# Patient Record
Sex: Female | Born: 1980 | Hispanic: Yes | Marital: Married | Smoking: Never smoker
Health system: Southern US, Community
[De-identification: ages and names within clinical notes are randomized; demographics above are authoritative.]

## PROBLEM LIST (undated history)

## (undated) DIAGNOSIS — F329 Major depressive disorder, single episode, unspecified: Secondary | ICD-10-CM

## (undated) DIAGNOSIS — A159 Respiratory tuberculosis unspecified: Secondary | ICD-10-CM

## (undated) DIAGNOSIS — F32A Depression, unspecified: Secondary | ICD-10-CM

## (undated) DIAGNOSIS — F419 Anxiety disorder, unspecified: Secondary | ICD-10-CM

## (undated) HISTORY — PX: NO PAST SURGERIES: SHX2092

## (undated) HISTORY — DX: Respiratory tuberculosis unspecified: A15.9

---

## 2004-08-10 ENCOUNTER — Observation Stay: Payer: Self-pay | Admitting: Obstetrics and Gynecology

## 2004-08-14 ENCOUNTER — Observation Stay: Payer: Self-pay | Admitting: Certified Nurse Midwife

## 2004-08-15 ENCOUNTER — Inpatient Hospital Stay: Payer: Self-pay | Admitting: Obstetrics and Gynecology

## 2005-08-07 ENCOUNTER — Emergency Department: Payer: Self-pay | Admitting: Emergency Medicine

## 2006-01-05 ENCOUNTER — Inpatient Hospital Stay: Payer: Self-pay

## 2008-02-15 ENCOUNTER — Inpatient Hospital Stay: Payer: Self-pay | Admitting: Obstetrics and Gynecology

## 2009-11-10 ENCOUNTER — Ambulatory Visit: Payer: Self-pay | Admitting: Family Medicine

## 2010-03-28 ENCOUNTER — Inpatient Hospital Stay: Payer: Self-pay | Admitting: Obstetrics and Gynecology

## 2014-01-29 ENCOUNTER — Encounter: Payer: Self-pay | Admitting: Obstetrics & Gynecology

## 2014-03-24 ENCOUNTER — Ambulatory Visit: Payer: Self-pay | Admitting: Family Medicine

## 2014-04-20 ENCOUNTER — Observation Stay: Payer: Self-pay | Admitting: Obstetrics and Gynecology

## 2014-04-24 ENCOUNTER — Inpatient Hospital Stay: Payer: Self-pay

## 2014-04-24 LAB — CBC WITH DIFFERENTIAL/PLATELET
BASOS ABS: 0 10*3/uL (ref 0.0–0.1)
BASOS PCT: 0.5 %
EOS ABS: 0 10*3/uL (ref 0.0–0.7)
Eosinophil %: 0.4 %
HCT: 39.9 % (ref 35.0–47.0)
HGB: 13.2 g/dL (ref 12.0–16.0)
LYMPHS PCT: 17.1 %
Lymphocyte #: 1.7 10*3/uL (ref 1.0–3.6)
MCH: 30.1 pg (ref 26.0–34.0)
MCHC: 33.2 g/dL (ref 32.0–36.0)
MCV: 91 fL (ref 80–100)
MONO ABS: 0.5 x10 3/mm (ref 0.2–0.9)
MONOS PCT: 4.7 %
NEUTROS ABS: 7.9 10*3/uL — AB (ref 1.4–6.5)
Neutrophil %: 77.3 %
PLATELETS: 230 10*3/uL (ref 150–440)
RBC: 4.4 10*6/uL (ref 3.80–5.20)
RDW: 14.8 % — AB (ref 11.5–14.5)
WBC: 10.2 10*3/uL (ref 3.6–11.0)

## 2014-04-25 LAB — HEMATOCRIT: HCT: 36.2 % (ref 35.0–47.0)

## 2015-02-02 NOTE — H&P (Signed)
L&D Evaluation:  History:  HPI 34 y/[facility]4004 [facility]. Is in active labor, uc's beginning this am, small bloody show, denies leaking fluid, baby is actve. care @ [facility] late entry to care, HX PPH, ABN pap, GBS negative.   Presents with contractions   Patient's Medical History No Chronic Illness   Patient's Surgical History none   Medications Pre Natal Vitamins   Allergies NKDA   Social History none   Family History Non-Contributory   ROS:  ROS All systems were reviewed.  HEENT, CNS, GI, GU, Respiratory, CV, Renal and Musculoskeletal systems were found to be normal.   Exam:  Vital Signs stable   Urine Protein not completed   General no apparent distress   Mental Status clear   Chest clear   Heart normal sinus rhythm   Abdomen gravid, non-tender   Estimated Fetal Weight Average for gestational age   Fetal Position vtx   Fundal Height term   Back no CVAT   Edema 1+   Reflexes 2+   Clonus negative   Pelvic no external lesions, 5cm upon admission recent exam 7-8cm BBOW vtx @ -1 small show.   Mebranes Intact   FHT normal rate with no decels, baseline 130's 140's avg variability withaccels   Fetal Heart Rate 144   Ucx regular, Q 2/3 mins 60 sec strong   Skin dry   Impression:  Impression active labor   Plan:  Plan monitor contractions and for cervical change   Comments Admitted, knows what to expect, husband at bedside supportive. Interpreter notified. Requests pain meds, stadol given, decelines epidural.   Electronic Signatures: Albertina ParrLugiano, Halimah Bewick B (CNM)  (Signed 31-Jul-15 09:40)  Authored: L&D Evaluation   Last Updated: 31-Jul-15 09:40 by Albertina ParrLugiano, Kasmira Cacioppo B (CNM)

## 2016-07-06 LAB — HM PAP SMEAR: HM PAP: NEGATIVE

## 2017-09-25 NOTE — L&D Delivery Note (Signed)
Delivery Note At 9:56 AM a viable and female child was delivered via Vaginal, Spontaneous (Presentation: VTX ;  ).  APGAR: 8, 9; weight 6 lb 9.5 oz (2990 g).   Placenta status: intact , .  Cord: delayed cord clamping with the following complications: none.   Head delivered within 5 minutes of pushing . Student Bevelyn NgoMaggie Cheek assisted me with the delivery . Shoulder and body delivered with difficulty  0.2 mg Methergine IM administered prophylactically given her prior h/o of PPH  Anesthesia:  CLE Episiotomy:none   Lacerations: second degree  Suture Repair: 2.0 3.0 vicryl Est. Blood Loss (mL):  200cc  Mom to postpartum.  Baby to Couplet care / Skin to Skin.  Amber Stark 04/24/2018, 10:17 AM

## 2017-12-28 LAB — OB RESULTS CONSOLE HGB/HCT, BLOOD
HCT: 34
Hemoglobin: 11.1

## 2017-12-28 LAB — OB RESULTS CONSOLE PLATELET COUNT: Platelets: 298

## 2017-12-28 LAB — OB RESULTS CONSOLE HIV ANTIBODY (ROUTINE TESTING): HIV: NONREACTIVE

## 2017-12-29 LAB — OB RESULTS CONSOLE VARICELLA ZOSTER ANTIBODY, IGG: Varicella: IMMUNE

## 2017-12-29 LAB — OB RESULTS CONSOLE ABO/RH: RH TYPE: POSITIVE

## 2017-12-29 LAB — OB RESULTS CONSOLE HEPATITIS B SURFACE ANTIGEN: HEP B S AG: NEGATIVE

## 2017-12-29 LAB — OB RESULTS CONSOLE RUBELLA ANTIBODY, IGM: Rubella: IMMUNE

## 2017-12-31 ENCOUNTER — Other Ambulatory Visit: Payer: Self-pay | Admitting: Advanced Practice Midwife

## 2017-12-31 DIAGNOSIS — Z3689 Encounter for other specified antenatal screening: Secondary | ICD-10-CM

## 2017-12-31 LAB — OB RESULTS CONSOLE GC/CHLAMYDIA
CHLAMYDIA, DNA PROBE: NEGATIVE
GC PROBE AMP, GENITAL: NEGATIVE

## 2018-01-10 ENCOUNTER — Ambulatory Visit: Payer: Self-pay

## 2018-01-10 ENCOUNTER — Ambulatory Visit
Admission: RE | Admit: 2018-01-10 | Discharge: 2018-01-10 | Disposition: A | Discharge status: Home / Self Care | Payer: Self-pay | Source: Ambulatory Visit | Attending: Maternal & Fetal Medicine | Admitting: Maternal & Fetal Medicine

## 2018-01-10 VITALS — BP 139/82 | HR 85 | Temp 98.2°F | Resp 18 | Wt 140.2 lb

## 2018-01-10 DIAGNOSIS — Z3689 Encounter for other specified antenatal screening: Secondary | ICD-10-CM

## 2018-01-10 DIAGNOSIS — O0932 Supervision of pregnancy with insufficient antenatal care, second trimester: Secondary | ICD-10-CM | POA: Insufficient documentation

## 2018-01-10 DIAGNOSIS — O09522 Supervision of elderly multigravida, second trimester: Secondary | ICD-10-CM | POA: Insufficient documentation

## 2018-01-10 DIAGNOSIS — Z3A24 24 weeks gestation of pregnancy: Secondary | ICD-10-CM | POA: Insufficient documentation

## 2018-01-10 NOTE — Progress Notes (Addendum)
Referring Provider:   [facility] Department Length of Consultation: 45 minutes  [name] was referred to [facility] for genetic counseling because of advanced maternal age.  The patient will be 37 years old at the time of delivery.  This note summarizes the information we discussed with the aid of a Spanish interpreter.    We explained that the chance of a chromosome abnormality increases with maternal age.  Chromosomes and examples of chromosome problems were reviewed.  Humans typically have 46 chromosomes in each cell, with half passed through each sperm and egg.  Any change in the number or structure of chromosomes can increase the risk of problems in the physical and mental development of a pregnancy.   Based upon age of the patient, the chance of any chromosome abnormality was 1 in 89. The chance of Down syndrome, the most common chromosome problem associated with maternal age, was 1 in 83.  The risk of chromosome problems is in addition to the 3% general population risk for birth defects and mental retardation.  The greatest chance, of course, is that the baby would be born in good health.  We discussed the following prenatal screening and testing options for this pregnancy given the current gestational age:  Targeted ultrasound uses high frequency sound waves to create an image of the developing fetus.  An ultrasound is often recommended as a routine means of evaluating the pregnancy.  It is also used to screen for fetal anatomy problems (for example, a heart defect) that might be suggestive of a chromosomal or other abnormality.   Amniocentesis involves the removal of a small amount of amniotic fluid from the sac surrounding the fetus with the use of a thin needle inserted through the maternal abdomen and uterus.  Ultrasound guidance is used throughout the procedure.  Fetal cells from amniotic fluid are directly evaluated and > 99.5% of  chromosome problems and > 98% of open neural tube defects can be detected. This procedure is generally performed after the 15th week of pregnancy.  The main risks to this procedure include complications leading to miscarriage in less than 1 in 200 cases (0.5%).  We also reviewed the availability of cell free fetal DNA testing from maternal blood to determine whether or not the baby may have either Down syndrome, trisomy 40, or trisomy 92.  This test utilizes a maternal blood sample and DNA sequencing technology to isolate circulating cell free fetal DNA from maternal plasma.  The fetal DNA can then be analyzed for DNA sequences that are derived from the three most common chromosomes involved in aneuploidy, chromosomes 13, 18, and 21.  If the overall amount of DNA is greater than the expected level for any of these chromosomes, aneuploidy is suspected.  While we do not consider it a replacement for invasive testing and karyotype analysis, a negative result from this testing would be reassuring, though not a guarantee of a normal chromosome complement for the baby.  An abnormal result is certainly suggestive of an abnormal chromosome complement, though we would still recommend amniocentesis to confirm any findings from this testing.  Cystic Fibrosis and Spinal Muscular Atrophy (SMA) screening were also discussed with the patient. Both conditions are recessive, which means that both parents must be carriers in order to have a child with the disease.  Cystic fibrosis (CF) is one of the most common genetic conditions in persons of Caucasian ancestry.  This condition occurs in approximately 1 in 2,500  Caucasian persons and results in thickened secretions in the lungs, digestive, and reproductive systems.  For a baby to be at risk for having CF, both of the parents must be carriers for this condition.  Approximately 1 in 3625 Caucasian persons is a carrier for CF.  Current carrier testing looks for the most common  mutations in the gene for CF and can detect approximately 90% of carriers in the Caucasian population.  This means that the carrier screening can greatly reduce, but cannot eliminate, the chance for an individual to have a child with CF.  If an individual is found to be a carrier for CF, then carrier testing would be available for the partner. As part of [place] newborn screening profile, all babies born in the state of [place] will have a two-tier screening process.  Specimens are first tested to determine the concentration of immunoreactive trypsinogen (IRT).  The top 5% of specimens with the highest IRT values then undergo DNA testing using a panel of over 40 common CF mutations. SMA is a neurodegenerative disorder that leads to atrophy of skeletal muscle and overall weakness.  This condition is also more prevalent in the Caucasian population, with 1 in 40-1 in 60 persons being a carrier and 1 in 6,000-1 in 10,000 children being affected.  There are multiple forms of the disease, with some causing death in infancy to other forms with survival into adulthood.  The genetics of SMA is complex, but carrier screening can detect up to 95% of carriers in the Caucasian population.  Similar to CF, a negative result can greatly reduce, but cannot eliminate, the chance to have a child with SMA.  We obtained a detailed family history and pregnancy history.  The remainder of the family history is unremarkable for birth defects, developmental delays, recurrent pregnancy loss or known chromosome abnormalities.  [name] stated that this is the sixth pregnancy for she and her husband, Amber Stark.  She reported no complications or exposures that would be expected to increase the risk for birth defects.  After consideration of the options, [name] elected to proceed with MaterniT21 PLUS with SCA.  She declined CF and SMA carrier screening.  Prior hemoglobinopathy screening was normal at  [facility].  An ultrasound was performed at the time of the visit.  The gestational age was consistent with  24 weeks, 1 day.   No markers of aneuploidy were noted, but it is important to remember that a normal ultrasound does not exclude the possibility of birth defect or chromosome condition.  Please refer to the ultrasound report for details of that study.  [name] was encouraged to call with questions or concerns.  We can be contacted at [phone].   Tests Ordered: MaterniT21 PLUS with SCA   Cherly Andersoneborah F. Wells, MS, CGC  I was immediately available and supervising. Argentina PonderAndra H. Isaiah Torok, MD [facility]

## 2018-01-16 LAB — MATERNIT21 PLUS CORE+SCA
CHROMOSOME 13: NEGATIVE
Chromosome 18: NEGATIVE
Chromosome 21: NEGATIVE
Y Chromosome: NOT DETECTED

## 2018-01-17 ENCOUNTER — Telehealth: Payer: Self-pay | Admitting: Obstetrics and Gynecology

## 2018-01-17 NOTE — Telephone Encounter (Signed)
The patient was informed of the results of her recent MaterniT21 testing which yielded NEGATIVE results.  The patient's specimen showed DNA consistent with two copies of chromosomes 21, 18 and 13.  The sensitivity for trisomy 3221, trisomy 9218 and trisomy 2813 using this testing are reported as 99.1%, 99.9% and 91.7% respectively.  Thus, while the results of this testing are highly accurate, they are not considered diagnostic at this time.  Should more definitive information be desired, the patient may still consider amniocentesis.   As requested to know by the patient, sex chromosome analysis was included for this sample.  Results was consistent with a female fetus. This is predicted with >99% accuracy.    Cherly Andersoneborah F. Bettyann Birchler, MS, CGC

## 2018-01-24 ENCOUNTER — Other Ambulatory Visit: Payer: Self-pay

## 2018-01-24 DIAGNOSIS — Z62810 Personal history of physical and sexual abuse in childhood: Secondary | ICD-10-CM | POA: Insufficient documentation

## 2018-02-07 NOTE — Addendum Note (Signed)
Encounter addended by: Lady Deutscher, MD on: 02/07/2018 8:42 AM  Actions taken: Sign clinical note

## 2018-02-15 LAB — OB RESULTS CONSOLE HIV ANTIBODY (ROUTINE TESTING): HIV: NONREACTIVE

## 2018-02-16 LAB — OB RESULTS CONSOLE RPR: RPR: NONREACTIVE

## 2018-04-07 LAB — OB RESULTS CONSOLE GBS: GBS: NEGATIVE

## 2018-04-09 LAB — OB RESULTS CONSOLE GC/CHLAMYDIA
CHLAMYDIA, DNA PROBE: NEGATIVE
Gonorrhea: NEGATIVE

## 2018-04-24 ENCOUNTER — Inpatient Hospital Stay: Payer: Medicaid Other | Admitting: Anesthesiology

## 2018-04-24 ENCOUNTER — Other Ambulatory Visit: Payer: Self-pay

## 2018-04-24 ENCOUNTER — Inpatient Hospital Stay
Admission: EM | Admit: 2018-04-24 | Discharge: 2018-04-25 | DRG: 806 | Disposition: A | Discharge status: Home / Self Care | Payer: Medicaid Other | Attending: Obstetrics and Gynecology | Admitting: Obstetrics and Gynecology

## 2018-04-24 DIAGNOSIS — D62 Acute posthemorrhagic anemia: Secondary | ICD-10-CM | POA: Diagnosis not present

## 2018-04-24 DIAGNOSIS — O9081 Anemia of the puerperium: Secondary | ICD-10-CM | POA: Diagnosis not present

## 2018-04-24 DIAGNOSIS — O4292 Full-term premature rupture of membranes, unspecified as to length of time between rupture and onset of labor: Secondary | ICD-10-CM | POA: Diagnosis present

## 2018-04-24 DIAGNOSIS — Z3A38 38 weeks gestation of pregnancy: Secondary | ICD-10-CM | POA: Diagnosis not present

## 2018-04-24 HISTORY — DX: Depression, unspecified: F32.A

## 2018-04-24 HISTORY — DX: Anxiety disorder, unspecified: F41.9

## 2018-04-24 HISTORY — DX: Major depressive disorder, single episode, unspecified: F32.9

## 2018-04-24 LAB — RAPID HIV SCREEN (HIV 1/2 AB+AG)
HIV 1/2 Antibodies: NONREACTIVE
HIV-1 P24 ANTIGEN - HIV24: NONREACTIVE

## 2018-04-24 LAB — CBC
HEMATOCRIT: 36.8 % (ref 35.0–47.0)
Hemoglobin: 12.9 g/dL (ref 12.0–16.0)
MCH: 30.7 pg (ref 26.0–34.0)
MCHC: 35 g/dL (ref 32.0–36.0)
MCV: 87.7 fL (ref 80.0–100.0)
Platelets: 231 10*3/uL (ref 150–440)
RBC: 4.19 MIL/uL (ref 3.80–5.20)
RDW: 15.1 % — ABNORMAL HIGH (ref 11.5–14.5)
WBC: 10.8 10*3/uL (ref 3.6–11.0)

## 2018-04-24 LAB — TYPE AND SCREEN
ABO/RH(D): O POS
ANTIBODY SCREEN: NEGATIVE

## 2018-04-24 MED ORDER — ACETAMINOPHEN 325 MG PO TABS
650.0000 mg | ORAL_TABLET | ORAL | Status: DC | PRN
Start: 2018-04-24 — End: 2018-04-25

## 2018-04-24 MED ORDER — ONDANSETRON HCL 4 MG/2ML IJ SOLN: 4.0000 mg | Freq: Four times a day (QID) | INTRAMUSCULAR | Status: DC | PRN

## 2018-04-24 MED ORDER — CARBOPROST TROMETHAMINE 250 MCG/ML IM SOLN
INTRAMUSCULAR | Status: AC
  Filled 2018-04-24: qty 1

## 2018-04-24 MED ORDER — LACTATED RINGERS IV SOLN: 500.0000 mL | INTRAVENOUS | Status: DC | PRN

## 2018-04-24 MED ORDER — BENZOCAINE-MENTHOL 20-0.5 % EX AERO: 1.0000 "application " | INHALATION_SPRAY | CUTANEOUS | Status: DC | PRN

## 2018-04-24 MED ORDER — OXYTOCIN 10 UNIT/ML IJ SOLN
INTRAMUSCULAR | Status: AC
  Filled 2018-04-24: qty 2

## 2018-04-24 MED ORDER — LIDOCAINE HCL (PF) 1 % IJ SOLN
INTRAMUSCULAR | Status: AC
  Filled 2018-04-24: qty 30

## 2018-04-24 MED ORDER — IBUPROFEN 600 MG PO TABS
600.0000 mg | ORAL_TABLET | Freq: Four times a day (QID) | ORAL | Status: DC
  Administered 2018-04-24 – 2018-04-25 (×5): 600 mg via ORAL
  Filled 2018-04-24 (×5): qty 1

## 2018-04-24 MED ORDER — MEASLES, MUMPS & RUBELLA VAC ~~LOC~~ INJ
0.5000 mL | INJECTION | Freq: Once | SUBCUTANEOUS | Status: AC
  Administered 2018-04-25: 0.5 mL via SUBCUTANEOUS
  Filled 2018-04-24 (×2): qty 0.5

## 2018-04-24 MED ORDER — LIDOCAINE HCL (PF) 1 % IJ SOLN
30.0000 mL | INTRAMUSCULAR | Status: AC | PRN
  Administered 2018-04-24: 3 mL via SUBCUTANEOUS

## 2018-04-24 MED ORDER — OXYTOCIN 40 UNITS IN LACTATED RINGERS INFUSION - SIMPLE MED: 2.5000 [IU]/h | INTRAVENOUS | Status: DC

## 2018-04-24 MED ORDER — LIDOCAINE-EPINEPHRINE (PF) 1.5 %-1:200000 IJ SOLN
INTRAMUSCULAR | Status: DC | PRN
  Administered 2018-04-24: 4 mL via EPIDURAL

## 2018-04-24 MED ORDER — DIPHENHYDRAMINE HCL 25 MG PO CAPS: 25.0000 mg | ORAL_CAPSULE | Freq: Four times a day (QID) | ORAL | Status: DC | PRN

## 2018-04-24 MED ORDER — OXYTOCIN BOLUS FROM INFUSION
500.0000 mL | Freq: Once | INTRAVENOUS | Status: AC
  Administered 2018-04-24: 500 mL via INTRAVENOUS

## 2018-04-24 MED ORDER — ACETAMINOPHEN 325 MG PO TABS: 650.0000 mg | ORAL_TABLET | ORAL | Status: DC | PRN

## 2018-04-24 MED ORDER — ZOLPIDEM TARTRATE 5 MG PO TABS: 5.0000 mg | ORAL_TABLET | Freq: Every evening | ORAL | Status: DC | PRN

## 2018-04-24 MED ORDER — SENNOSIDES-DOCUSATE SODIUM 8.6-50 MG PO TABS: 2.0000 | ORAL_TABLET | ORAL | Status: DC

## 2018-04-24 MED ORDER — TERBUTALINE SULFATE 1 MG/ML IJ SOLN: 0.2500 mg | Freq: Once | INTRAMUSCULAR | Status: DC | PRN

## 2018-04-24 MED ORDER — EPHEDRINE 5 MG/ML INJ
10.0000 mg | INTRAVENOUS | Status: DC | PRN
  Filled 2018-04-24: qty 2

## 2018-04-24 MED ORDER — METHYLERGONOVINE MALEATE 0.2 MG/ML IJ SOLN
INTRAMUSCULAR | Status: AC
  Administered 2018-04-24: 0.2 mg via INTRAMUSCULAR
  Filled 2018-04-24: qty 1

## 2018-04-24 MED ORDER — PRENATAL MULTIVITAMIN CH
1.0000 | ORAL_TABLET | Freq: Every day | ORAL | Status: DC
  Administered 2018-04-24 – 2018-04-25 (×2): 1 via ORAL
  Filled 2018-04-24 (×2): qty 1

## 2018-04-24 MED ORDER — LACTATED RINGERS IV SOLN
INTRAVENOUS | Status: DC
  Administered 2018-04-24: 06:00:00 via INTRAVENOUS

## 2018-04-24 MED ORDER — SOD CITRATE-CITRIC ACID 500-334 MG/5ML PO SOLN: 30.0000 mL | ORAL | Status: DC | PRN

## 2018-04-24 MED ORDER — DIPHENHYDRAMINE HCL 50 MG/ML IJ SOLN: 12.5000 mg | INTRAMUSCULAR | Status: DC | PRN

## 2018-04-24 MED ORDER — OXYTOCIN 40 UNITS IN LACTATED RINGERS INFUSION - SIMPLE MED
INTRAVENOUS | Status: AC
  Filled 2018-04-24: qty 1000

## 2018-04-24 MED ORDER — PHENYLEPHRINE 40 MCG/ML (10ML) SYRINGE FOR IV PUSH (FOR BLOOD PRESSURE SUPPORT)
80.0000 ug | PREFILLED_SYRINGE | INTRAVENOUS | Status: DC | PRN
  Filled 2018-04-24: qty 5

## 2018-04-24 MED ORDER — OXYTOCIN 40 UNITS IN LACTATED RINGERS INFUSION - SIMPLE MED
1.0000 m[IU]/min | INTRAVENOUS | Status: DC
  Administered 2018-04-24: 2 m[IU]/min via INTRAVENOUS

## 2018-04-24 MED ORDER — SIMETHICONE 80 MG PO CHEW: 80.0000 mg | CHEWABLE_TABLET | ORAL | Status: DC | PRN

## 2018-04-24 MED ORDER — FENTANYL 2.5 MCG/ML W/ROPIVACAINE 0.15% IN NS 100 ML EPIDURAL (ARMC)
12.0000 mL/h | EPIDURAL | Status: DC
  Administered 2018-04-24: 12 mL/h via EPIDURAL
  Filled 2018-04-24: qty 100

## 2018-04-24 MED ORDER — DIBUCAINE 1 % RE OINT: 1.0000 "application " | TOPICAL_OINTMENT | RECTAL | Status: DC | PRN

## 2018-04-24 MED ORDER — MISOPROSTOL 200 MCG PO TABS
ORAL_TABLET | ORAL | Status: AC
  Filled 2018-04-24: qty 4

## 2018-04-24 MED ORDER — LACTATED RINGERS IV SOLN
500.0000 mL | Freq: Once | INTRAVENOUS | Status: AC
  Administered 2018-04-24: 500 mL via INTRAVENOUS

## 2018-04-24 MED ORDER — ONDANSETRON HCL 4 MG/2ML IJ SOLN: 4.0000 mg | INTRAMUSCULAR | Status: DC | PRN

## 2018-04-24 MED ORDER — BUPIVACAINE HCL (PF) 0.25 % IJ SOLN
INTRAMUSCULAR | Status: DC | PRN
  Administered 2018-04-24: 5 mL via EPIDURAL

## 2018-04-24 MED ORDER — COCONUT OIL OIL: 1.0000 "application " | TOPICAL_OIL | Status: DC | PRN

## 2018-04-24 MED ORDER — FERROUS SULFATE 325 (65 FE) MG PO TABS
325.0000 mg | ORAL_TABLET | Freq: Two times a day (BID) | ORAL | Status: DC
  Administered 2018-04-24 – 2018-04-25 (×2): 325 mg via ORAL
  Filled 2018-04-24 (×2): qty 1

## 2018-04-24 MED ORDER — HYDROCODONE-ACETAMINOPHEN 5-325 MG PO TABS
1.0000 | ORAL_TABLET | ORAL | Status: DC | PRN
Start: 2018-04-24 — End: 2018-04-25

## 2018-04-24 MED ORDER — BUTORPHANOL TARTRATE 1 MG/ML IJ SOLN: 1.0000 mg | INTRAMUSCULAR | Status: DC | PRN

## 2018-04-24 MED ORDER — MAGNESIUM HYDROXIDE 400 MG/5ML PO SUSP: 30.0000 mL | ORAL | Status: DC | PRN

## 2018-04-24 MED ORDER — METHYLERGONOVINE MALEATE 0.2 MG/ML IJ SOLN
0.2000 mg | Freq: Once | INTRAMUSCULAR | Status: AC
  Administered 2018-04-24: 0.2 mg via INTRAMUSCULAR

## 2018-04-24 MED ORDER — AMMONIA AROMATIC IN INHA
RESPIRATORY_TRACT | Status: AC
  Filled 2018-04-24: qty 10

## 2018-04-24 MED ORDER — ONDANSETRON HCL 4 MG PO TABS: 4.0000 mg | ORAL_TABLET | ORAL | Status: DC | PRN

## 2018-04-24 MED ORDER — WITCH HAZEL-GLYCERIN EX PADS: 1.0000 "application " | MEDICATED_PAD | CUTANEOUS | Status: DC | PRN

## 2018-04-24 NOTE — OB Triage Note (Signed)
Patient arrived in triage with c/o leaking of fluid since 0300. Reports good fetal movement and some contractions. Denies any vaginal bleeding. EFM applied and assessing. Triage information obtained using interpreter [facility] #[id number]. Also discussed plan of care with patient, who verbalized understanding, and answered all questions.

## 2018-04-24 NOTE — Clinical Social Work Note (Signed)
CSW aware of consult for transportation concerns. CSW will complete assessment tomorrow. York SpanielMonica Jobany Montellano MSW,LCSW [phone]

## 2018-04-24 NOTE — Progress Notes (Signed)
Obtained epidural consent using interpreter Dois DavenportSandra [phone]. Discussed options for plan of care. Patient would like to ambulate for 1-2 hours, then have cervix rechecked prior to starting Pitocin.

## 2018-04-24 NOTE — Discharge Summary (Signed)
Obstetric Discharge Summary   Patient ID: Patient Name: Amber Stark DOB: 08/04/1981 MRN: [id number]  Date of Admission: 04/24/2018 Date of Delivery: 8/1/2019at 0956  Delivered by: SchermerhornMD ,+Student MAggie Cheek  Date of Discharge: 04/25/18  Primary OB:  [facility]  MWU:[id number]:Patient's last menstrual period was 07/29/2017 (exact date). EDC Estimated Date of Delivery: 05/05/18 Gestational Age at Delivery: 3931w3d   Antepartum complications: h/o depression , prior h/o PPH , AMA Admitting Diagnosis:labor  Secondary Diagnoses: Patient Active Problem List   Diagnosis Date Noted  . Full-term premature rupture of membranes 04/24/2018  . Advanced maternal age in multigravida, second trimester     Augmentation:None Complications: None Intrapartum complications/course:  Delivery Type: spontaneous vaginal delivery Anesthesia: epidural Placenta: sponatneous Laceration:  Episiotomy: none  Newborn Data: Live born child  Birth Weight: 6 lb 9.5 oz (2990 g) APGAR: 8, 9  Newborn Delivery   Birth date/time:  04/24/2018 09:56:00 Delivery type:  Vaginal, Spontaneous         Postpartum Course  Patient had an uncomplicated postpartum course.  By time of discharge on PPD#1, her pain was controlled on oral pain medications; she had appropriate lochia and was ambulating, voiding without difficulty and tolerating regular diet.  She was deemed stable for discharge to home.       Labs: CBC Latest Ref Rng & Units 04/25/2018 04/24/2018 12/28/2017  WBC 3.6 - 11.0 K/uL 11.4(H) 10.8 -  Hemoglobin 12.0 - 16.0 g/dL 11.1(L) 12.9 11.1  Hematocrit 35.0 - 47.0 % 32.6(L) 36.8 34  Platelets 150 - 440 K/uL 196 231 298   O POS  Physical exam:  BP (!) 126/57 (BP Location: Right Arm)   Pulse 70   Temp 97.8 F (36.6 C) (Oral)   Resp 20   Ht 4' 9.48" (1.46 m)   Wt 155 lb 12.8 oz (70.7 kg)   LMP 07/29/2017 (Exact Date)   SpO2 99%   Breastfeeding? Unknown   BMI 33.15 kg/m  General: alert and no  distress Pulm: normal respiratory effort Lochia: appropriate Abdomen: soft, NT Uterine Fundus: firm, below umbilicus Perineum: minimal edema, laceration well approximated Extremities: No evidence of DVT seen on physical exam. No lower extremity edema.   Disposition: stable, discharge to home Baby Feeding: breastmilk Baby Disposition: home with mom  Contraception: Depo  Prenatal Labs: Blood type/Rh O/Positive/-- (04/06 0000)  Antibody screen neg  Rubella Immune  Varicella Immune  RPR NR  HBsAg Neg  HIV NR  GC neg  Chlamydia neg  Genetic screening negative  1 hour GTT elevated  3 hour GTT normal  GBS neg  Tdap and Flu UTD  Plan:  Amber Stark was discharged to home in good condition. [facility] provider in 6 weeks.  Discharge Instructions: Per After Visit Summary. Activity: Advance as tolerated. Pelvic rest for 6 weeks.   Diet: Regular Discharge Medications: Allergies as of 04/25/2018   No Known Allergies     Medication List    TAKE these medications   acetaminophen 325 MG tablet Commonly known as:  TYLENOL Take 2 tablets (650 mg total) by mouth every 4 (four) hours as needed (for pain scale < 4ORtemperature>/=100.5 F).   ferrous sulfate 325 (65 FE) MG EC tablet Take 325 mg by mouth 3 (three) times daily with meals.   ibuprofen 600 MG tablet Commonly known as:  ADVIL,MOTRIN Take 1 tablet (600 mg total) by mouth every 6 (six) hours.   prenatal multivitamin Tabs tablet Take  1 tablet by mouth daily at 12 noon.      Outpatient follow up:  Follow-up Information    [facility] Follow up in 6 week(s).   Contact information: [address] [place] [address] [phone]           Signed:  Heloise Ochoa A 04/25/2018  11:45 AM

## 2018-04-24 NOTE — Plan of Care (Signed)
RN reviewed plan of care with patient via interpreter. All questions answered. Will monitor closely.

## 2018-04-24 NOTE — Anesthesia Preprocedure Evaluation (Signed)
Anesthesia Evaluation  Patient identified by MRN, date of birth, ID band Patient awake    Reviewed: Allergy & Precautions, H&P , NPO status , Patient's Chart, lab work & pertinent test results, reviewed documented beta blocker date and time   Airway Mallampati: II  TM Distance: >3 FB Neck ROM: full    Dental no notable dental hx. (+) Teeth Intact   Pulmonary neg pulmonary ROS, Current Smoker,    Pulmonary exam normal breath sounds clear to auscultation       Cardiovascular Exercise Tolerance: Good negative cardio ROS   Rhythm:regular Rate:Normal     Neuro/Psych PSYCHIATRIC DISORDERS Anxiety Depression negative neurological ROS  negative psych ROS   GI/Hepatic negative GI ROS, Neg liver ROS,   Endo/Other  negative endocrine ROSdiabetes, Well Controlled, Type 2, Oral Hypoglycemic Agents  Renal/GU      Musculoskeletal   Abdominal   Peds  Hematology negative hematology ROS (+)   Anesthesia Other Findings   Reproductive/Obstetrics (+) Pregnancy                             Anesthesia Physical Anesthesia Plan  ASA: II  Anesthesia Plan: Epidural   Post-op Pain Management:    Induction:   PONV Risk Score and Plan:   Airway Management Planned:   Additional Equipment:   Intra-op Plan:   Post-operative Plan:   Informed Consent: I have reviewed the patients History and Physical, chart, labs and discussed the procedure including the risks, benefits and alternatives for the proposed anesthesia with the patient or authorized representative who has indicated his/her understanding and acceptance.     Plan Discussed with:   Anesthesia Plan Comments:         Anesthesia Quick Evaluation

## 2018-04-24 NOTE — Lactation Note (Signed)
This note was copied from a baby's chart. Lactation Consultation Note  Patient Name: Amber Stark ZOXWR'UToday's Date: 04/24/2018 Reason for consult: Follow-up assessment   Maternal Data    Feeding Feeding Type: Breast Fed Length of feed: 15 min  LATCH Score Latch: Grasps breast easily, tongue down, lips flanged, rhythmical sucking.  Audible Swallowing: A few with stimulation  Type of Nipple: Everted at rest and after stimulation  Comfort (Breast/Nipple): Soft / non-tender  Hold (Positioning): Assistance needed to correctly position infant at breast and maintain latch.  LATCH Score: 8  Interventions Interventions: Assisted with latch;Adjust position;Support pillows  Lactation Tools Discussed/Used     Consult Status Consult Status: Follow-up Date: 04/24/18 Follow-up type: In-patient LC to room to assist with breastfeeding. Infant initially too sleepy but placed skin to skin for a while. Infant was able to latch successfully and breast-fed for 15 minutes.    Arlyss Gandylicia Yicel Shannon 04/24/2018, 5:07 PM

## 2018-04-24 NOTE — Anesthesia Procedure Notes (Signed)
Epidural Patient location during procedure: OB  Staffing Performed: anesthesiologist   Preanesthetic Checklist Completed: patient identified, site marked, surgical consent, pre-op evaluation, timeout performed, IV checked, risks and benefits discussed and monitors and equipment checked  Epidural Patient position: sitting Prep: Betadine Patient monitoring: heart rate, continuous pulse ox and blood pressure Approach: midline Location: L4-L5 Injection technique: LOR saline  Needle:  Needle type: Tuohy  Needle gauge: 17 G Needle length: 9 cm and 9 Needle insertion depth: 5 cm Catheter type: closed end flexible Catheter size: 19 Gauge Catheter at skin depth: 11 cm Test dose: negative and 1.5% lidocaine with Epi 1:200 K  Assessment Sensory level: T10 Events: blood not aspirated, injection not painful, no injection resistance, negative IV test and no paresthesia  Additional Notes   Patient tolerated the insertion well without complications.-SATD -IVTD. No paresthesia. Refer to [facility] nursing for VS and dosingReason for block:procedure for pain     

## 2018-04-24 NOTE — H&P (Signed)
OB ADMISSION/ HISTORY & PHYSICAL:  Admission Date: 04/24/2018  4:23 AM  Admit Diagnosis: PROM  Amber DeemMaria D Gonzalez Stark is a 37 y.o. 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 Anxiety   . Depression      Past surgical history: History reviewed. No pertinent surgical history.  Family History: History reviewed. No pertinent family history.   Social History:  reports that she has never smoked. She has never used smokeless tobacco. She reports that she does not drink alcohol or use drugs.   Allergies: Patient has no known allergies.    Current Medications at time of admission:  Prior to Admission medications   Medication Sig Start Date End Date Taking? Authorizing Provider  Prenatal Vit-Fe Fumarate-FA (PRENATAL MULTIVITAMIN) TABS tablet Take 1 tablet by mouth daily at 12 noon.   Yes [provider]  ferrous sulfate 325 (65 FE) MG EC tablet Take 325 mg by mouth 3 (three) times daily with meals.    [provider]     Review of Systems: Active FM onset of ctx @ 0300 currently every 3-9 minutes LOF  / SROM: 0330   Physical Exam:  VS: Blood pressure 135/77, pulse 74, temperature 98.4 F (36.9 C), temperature source Oral, resp. rate 18, last menstrual period 07/29/2017.  FHT: 145, moderate variability, +accels, no decels, though 1 hr ago had a subtle late decel with return to baseline variability  TOCO: q3-7 min SVE:  Dilation: 3 / Effacement (%): 50 /  Station: -3    Cephalic by leopolds  Prenatal Labs: Blood type/Rh O/Positive/-- (04/06 0000)  Antibody screen neg  Rubella Immune  Varicella Immune  RPR NR  HBsAg Neg  HIV NR  GC neg  Chlamydia neg  Genetic screening negative  1 hour GTT elevated  3 hour GTT normal  GBS neg   No results found.  Assessment: 38+[redacted] weeks gestation 1 stage of labor FHR category 1   Plan:   Admit for augmentation of labor Labs pending Epidural when desired Continuous fetal monitoring   1. Fetal Well being  - Fetal Tracing: 2 - Group B Streptococcus: neg - Presentation: vtx confirmed by Leopolds with nursing staff  2. Routine OB: - Prenatal labs reviewed, as above - Rh positive  3. Induction of Labor:  -  Contractions external toco in place -  Plan for aug with pitocin  4. Post Partum Planning: - Infant feeding: breast - Contraception: depo provera

## 2018-04-25 LAB — CBC
HEMATOCRIT: 32.6 % — AB (ref 35.0–47.0)
HEMOGLOBIN: 11.1 g/dL — AB (ref 12.0–16.0)
MCH: 30.3 pg (ref 26.0–34.0)
MCHC: 33.9 g/dL (ref 32.0–36.0)
MCV: 89.3 fL (ref 80.0–100.0)
Platelets: 196 10*3/uL (ref 150–440)
RBC: 3.65 MIL/uL — AB (ref 3.80–5.20)
RDW: 15.5 % — ABNORMAL HIGH (ref 11.5–14.5)
WBC: 11.4 10*3/uL — AB (ref 3.6–11.0)

## 2018-04-25 LAB — RPR: RPR: NONREACTIVE

## 2018-04-25 MED ORDER — MEDROXYPROGESTERONE ACETATE 150 MG/ML IM SUSP
150.0000 mg | INTRAMUSCULAR | Status: DC
  Administered 2018-04-25: 150 mg via INTRAMUSCULAR
  Filled 2018-04-25 (×2): qty 1

## 2018-04-25 MED ORDER — ACETAMINOPHEN 325 MG PO TABS
650.0000 mg | ORAL_TABLET | ORAL | Status: DC | PRN
Start: 2018-04-25 — End: 2021-03-15

## 2018-04-25 MED ORDER — IBUPROFEN 600 MG PO TABS: 600.0000 mg | ORAL_TABLET | Freq: Four times a day (QID) | ORAL | 0 refills | Status: DC

## 2018-04-25 NOTE — Anesthesia Postprocedure Evaluation (Signed)
Anesthesia Post Note  Patient: Amber Stark  Procedure(s) Performed: AN AD HOC LABOR EPIDURAL  Patient location during evaluation: Mother Baby Anesthesia Type: Epidural Level of consciousness: awake and alert Pain management: pain level controlled Vital Signs Assessment: post-procedure vital signs reviewed and stable Respiratory status: spontaneous breathing, nonlabored ventilation and respiratory function stable Cardiovascular status: stable Postop Assessment: no headache, no backache and epidural receding Anesthetic complications: no     Last Vitals:  Vitals:   04/24/18 2317 04/25/18 0718  BP: 112/82 (!) 126/57  Pulse: 64 70  Resp: 18 20  Temp: (!) 36.3 C 36.6 C  SpO2:  99%    Last Pain:  Vitals:   04/25/18 0718  TempSrc: Oral  PainSc:                  Rica MastBachich,  Brenten Janney M

## 2018-04-25 NOTE — Lactation Note (Signed)
This note was copied from a baby's chart. Lactation Consultation Note  Patient Name: Girl Josph MachoMaria Gonzalez Escobar Today's Date: 04/25/2018     Maternal Data Formula Feeding for Exclusion: No  Feeding Feeding Type: Bottle Fed - Formula Nipple Type: Slow - flow  LATCH Score                   Interventions    Lactation Tools Discussed/Used     Consult Status  LC went to mother's room during morning rounds to ask how breastfeeding is progressing. [employer] mobile interpreter was used. Mother states that breastfeeding is going well and denies questions or concerns.     Arlyss Gandylicia Ulis Kaps 04/25/2018, 10:31 AM

## 2018-04-25 NOTE — Progress Notes (Signed)
Mother ready for discharge.  Nurse Tech discharged pt home with baby via wheelchair.

## 2018-04-25 NOTE — Clinical Social Work Maternal (Signed)
  CLINICAL SOCIAL WORK MATERNAL/CHILD NOTE  Patient Details  Name: Amber Stark MRN: [id number] Date of Birth: 1980/11/10  Date:  04/25/2018  Clinical Social Worker Initiating Note:  Shela Leff MSW,LCSW Date/Time: Initiated:  04/25/18/      Child's Name:      Biological Parents:  Mother   Need for Interpreter:  Spanish   Reason for Referral:  Other (Comment)(transportation)   Address:  [address] [place] [address]    Phone number:  [phone] (home)     Additional phone number: none  Household Members/Support Persons (HM/SP):       HM/SP Name Relationship DOB or Age  HM/SP -1        HM/SP -2        HM/SP -3        HM/SP -4        HM/SP -5        HM/SP -6        HM/SP -7        HM/SP -8          Natural Supports (not living in the home):  Friends   Professional Supports: None   Employment: Unemployed   Type of Work:     Education:      Homebound arranged:    Museum/gallery curator Resources:  Self-Pay    Other Resources:  [facility]   Cultural/Religious Considerations Which May Impact Care:  none  Strengths:  Ability to meet basic needs , Compliance with medical plan , Home prepared for child    Psychotropic Medications:         Pediatrician:       Pediatrician List:   [place]      Pediatrician Fax Number:    Risk Factors/Current Problems:  Estate manager/land agent:  Alert , Able to Concentrate    Mood/Affect:  (pleasant and cooperative)   CSW Assessment: CSW met with patient along with a hospital interpreter to discuss concerns patient had expressed about transportation. Patient informed CSW that this is her 5th baby and that she and her husband live together with the children.She states she has all necessities for her newborn and has necessities in the home (I.e. Running water, electricity, food). She states her husband works  and sometimes she does not have transportation. She states for important times that her husband cannot transport, she will use a taxi.  She stated she has financial resources to obtain medications.  CSW ensured she had received education regarding postpartum depression. Patient had no further questions or concerns at this time.   CSW Plan/Description:  No Further Intervention Required/No Barriers to Discharge    Shela Leff, LCSW 04/25/2018, 4:47 PM

## 2018-04-25 NOTE — Progress Notes (Signed)
Post Partum Day 1 Subjective: Doing well, no complaints.  Tolerating regular diet, pain with PO meds, voiding and ambulating without difficulty.   No CP SOB Fever,Chills, N/V or leg pain; denies nipple or breast pain; no HA change of vision, RUQ/epigastric pain  Objective: BP (!) 126/57 (BP Location: Right Arm)   Pulse 70   Temp 97.8 F (36.6 C) (Oral)   Resp 20   Ht 4' 9.48" (1.46 m)   Wt 155 lb 12.8 oz (70.7 kg)   LMP 07/29/2017 (Exact Date)   SpO2 99%   Breastfeeding? Unknown   BMI 33.15 kg/m    Physical Exam:  General: NAD Breasts: soft/nontender CV: RRR Pulm: nl effort, CTABL Abdomen: soft, NT, BS x 4 Perineum: minimal edema, repair well approximated Lochia: small Uterine Fundus: fundus firm and 1 fb below umbilicus DVT Evaluation: no cords, ttp LEs   Recent Labs    04/24/18 0603 04/25/18 0512  HGB 12.9 11.1*  HCT 36.8 32.6*  WBC 10.8 11.4*  PLT 231 196    Assessment/Plan: 37 y.o. [id number] postpartum day # 1  - Continue routine PP care - Lactation consult prn  - Discussed contraceptive options, pt desires Depo prior to DC. - Acute blood loss anemia - hemodynamically stable and asymptomatic - Immunization status:  all Imms up to date    Disposition: Does desire Dc home today.     Timithy Arons A, CNM 04/25/2018  9:17 AM

## 2018-04-25 NOTE — Discharge Instructions (Signed)
Parto vaginal Vaginal Delivery Parto vaginal significa que usted dar a luz empujando al beb fuera del canal del parto (vagina). Un equipo de proveedores de atencin mdica la ayudar antes, durante y despus del parto vaginal. Las experiencias de los nacimientos son nicas para todas las mujeres y cada embarazo y las experiencias de nacimiento varan segn dnde elija dar a luz. Qu debo hacer a fin de prepararme para el nacimiento de mi beb? Antes del nacimiento de su beb, es importante que hable con su mdico sobre lo siguiente:  Sus preferencias en cuanto al trabajo de parto y parto. Estas pueden incluir lo siguiente: ? Medicamentos que le puedan administrar. ? Cmo controlar el dolor. Esto podra incluir tcnicas de alivio del dolor no mdicas o medicamentos inyectables para aliviar el dolor como la analgesia epidural. ? Cmo se los controlar a usted y a su beb durante el trabajo de parto y el parto. ? Quin puede estar en la sala de trabajo de parto y parto con usted. ? Sus opiniones en cuanto al parto quirrgico de su beb (parto por cesrea o cesrea) si esto fuera necesario. ? Sus opiniones en cuanto a recibir sangre donada a travs de un tubo (catter) intravenoso (transfusin de sangre) si esto fuera necesario.  Si usted puede: ? Tomar fotografas o videos del nacimiento. ? Comer durante el trabajo de parto y el parto. ? Moverse, caminar o cambiar de posicin durante el trabajo de parto y el parto.  Qu esperar despus del nacimiento de su beb, como: ? Si se ofrece el pinzamiento y corte tardo del cordn umbilical. ? Quin cuidar a su beb inmediatamente despus del nacimiento. ? Medicamentos o pruebas que pueden recomendarse para su beb. ? Si su hospital o centro de parto apoya la lactancia. ? Cunto tiempo estar en el hospital o en el centro de parto.  Cmo cualquier afeccin mdica que usted tenga puede afectar a su beb o la experiencia de trabajo de parto y  parto.  A fin de prepararse para el nacimiento de su beb, usted tambin debe:  Asistir a todas sus visitas de atencin mdica antes del parto (visitas prenatales) segn lo recomendado por su mdico. Esto es importante.  Preparacin del hogar para la llegada de su beb. Asegrese de tener lo siguiente: ? Paales. ? Ropa de beb. ? Equipo de alimentacin. ? Haga preparativos para que usted y su beb puedan dormir de manera segura.  Instale un asiento de beb en su vehculo. Haga verificar el asiento de beb de su coche por un instalador de asientos de beb para asegurarse de que est instalado en forma segura.  Piense en quin la ayudar con su nuevo beb en el hogar durante al menos las primeras semanas despus del parto.  Qu puedo esperar cuando llegue al centro de parto o el hospital? Una vez que se inicie el trabajo de parto y haya sido admitida en el hospital o centro de parto, el mdico podr hacer lo siguiente:  Revisar sus antecedentes de embarazo y cualquier inquietud que usted pueda tener.  Colocar un tubo (catter) intravenoso en una de sus venas. Esto se usa para administrarle lquidos y medicamentos.  Verificar su presin arterial, temperatura y pulso y la frecuencia cardaca (signos vitales).  Verificar si la bolsa de agua (saco amnitico) se ha roto (ruptura).  Hablar con usted sobre su plan de nacimiento y analizar las opciones para controlar el dolor.  Monitoreo Su mdico puede monitorear las contracciones (monitoreo uterino) y   el ritmo cardaco del beb (monitoreo fetal). Es posible que el monitoreo se necesite realizar:  Con frecuencia, pero no continuamente (intermitentemente).  Todo el tiempo o durante largos perodos a la vez (continuamente). El monitoreo continuo puede ser necesario si: ? Usted est recibiendo determinados medicamentos, tales como medicamentos para aliviar el dolor o para hacer que las contracciones ms fuertes. ? Tiene complicaciones  durante el embarazo o el trabajo de parto.  El monitoreo se puede realizar:  Al colocar un estetoscopio especial o un dispositivo manual de monitoreo en el abdomen o verificar los latidos cardacos de su beb y sentir su abdomen para controlar de contracciones. Este mtodo de monitoreo no registra los latidos cardacos de su beb ni sus contracciones de manera continua.  Colocar monitores en el abdomen (monitores externos) para registrar los latidos cardacos de su beb y la frecuencia y duracin de las contracciones. No tendr que tener colocados los monitores externos en todo momento.  Colocar monitores dentro del tero (monitores internos) para registrar los latidos cardacos de su beb y la frecuencia, duracin y fuerza de sus contracciones. ? Su mdico podr usar monitores internos si necesita ms informacin sobre la fuerza de las contracciones o la frecuencia cardaca del beb. ? Los monitores internos se colocan pasando un cable delgado y flexible a travs de la vagina hasta el tero. Segn el tipo de monitor, puede permanecer en el tero o en la cabeza del beb hasta el nacimiento. ? Su mdico analizar con usted los beneficios y los riesgos de usar un monitor interno y le pedir permiso antes de colocar los monitores.  Telemetra. Se trata de un tipo de monitoreo continuo que se puede realizar con monitores externos o internos. En lugar de tener que permanecer en la cama, usted puede moverse durante la telemetra. Pregunte a su mdico si la telemetra es una opcin para usted.  Examen fsico. Su mdico puede realizarle un examen fsico. Esto puede incluir lo siguiente:  Verificar en qu posicin se encuentra su beb: ? Con la cabeza hacia la vagina (cabeza abajo). Esta es la posicin ms frecuente. ? Con la cabeza hacia la parte superior del tero (cabeza arriba o de nalgas). Si su beb est en una posicin de nalgas, su mdico puede tratar de hacerlo girar para que quede cabeza abajo a  fin de poder tener un parto vaginal. Si parece que su beb no puede nacer con parto vaginal, su mdico puede recomendar una ciruga para dar a luz al beb. En casos muy poco frecuentes, usted puede dar a luz con parto vaginal si el beb est cabeza arriba (parto de nalgas). ? Posicin lateral (transversal). Los bebs que estn en posicin lateral no pueden nacer por parto vaginal.  Verificar el cuello uterino para determinar: ? Si se est afinando o estirando (borrando). ? Si se est abriendo (dilatando). ? Cunto se ha movido o ha bajado su beb por el canal del parto.  Cules son las tres etapas del trabajo de parto y el parto?  El trabajo de parto y el parto normales se dividen en tres etapas: Etapa 1  La etapa 1 es la etapa ms larga del trabajo de parto y puede durar horas o das. La etapa 1 incluye: ? Trabajo de parto temprano. Esto es cuando las contracciones pueden ser irregulares o regulares y leves. En general, las contracciones del trabajo de parto temprano se producen con ms de 10 minutos de diferencia. ? Trabajo de parto activo. Esto es cuando las   contracciones son ms largas, ms regulares, ms frecuentes y ms intensas. ? La fase de transicin. Esto es cuando las contracciones ocurren muy seguidas, son muy intensas y pueden durar ms que durante cualquier otra parte del trabajo de parto.  En general, las contracciones son leves, infrecuentes e irregulares al principio. Se hacen ms fuertes, ms frecuentes (aproximadamente cada 2 o 3 minutos) y ms regulares a medida que usted avanza desde un trabajo de parto temprano hasta un trabajo de parto activo y fase de transicin.  Muchas mujeres progresan a travs de la etapa 1 de forma natural, pero es posible que usted necesite ayuda para continuar avanzando. Si esto ocurre, su mdico puede hablar con usted sobre lo siguiente: ? La ruptura del saco amnitico si es que an no ha ocurrido. ? Administracin de medicamentos para ayudarla  a tener contracciones ms fuertes y ms frecuentes.  La etapa 1 finaliza cuando el cuello uterino est completamente dilatado hasta 4 pulgadas (10cm) y completamente borrado. Esto ocurre al final de la fase de transicin. Etapa 2  Una vez que el cuello uterino est totalmente borrado y dilatado a 4 pulgadas (10cm), usted puede comenzar a sentir ganas de pujar. Es comn que el cuerpo tome un descanso de manera natural antes de sentir ganas de pujar, especialmente si recibe una epidural u otros medicamentos para el dolor. Este perodo de descanso puede durar un mximo de 1 a 2 horas, segn su experiencia de parto nica.  Durante la etapa 2, las contracciones son generalmente menos doloras porque pujar ayuda a aliviar el dolor de las contracciones. En lugar del dolor de las contracciones, puede sentir un dolor urente y por estiramiento, especialmente cuando la parte ms ancha de la cabeza de su beb pasa a travs de la abertura vaginal (coronacin).  Su mdico controlar atentamente su avance con los pujos y el avance del beb a travs de la vagina durante la etapa 2.  Su mdico puede masajear el rea de la piel entre la abertura vaginal y el ano (perineo) o aplicar compresas tibias en el perineo. Esto ayuda al estiramiento ya que la cabeza del beb empieza a aparecer, lo cual puede ayudar a evitar un desgarro perineal. ? En algunos casos, se puede hacer una incisin en el peritoneo (episiotoma) para permitir que el beb pase a travs de la abertura vaginal. La episiotoma sirve para agrandar la abertura vaginal a fin de que el beb tenga ms espacio para pasar durante el parto.  Es muy importante respirar y concentrarse para que el mdico pueda controlar la salida de la cabeza del beb. Es posible que su mdico tenga que disminuir la intensidad de los pujos para ayudar a evitar un desgarro perineal.  Despus de que sale la cabeza del beb, en general salen los hombros y el resto del cuerpo muy  rpidamente y sin dificultad.  Una vez que el beb nace, se debe cortar el cordn umbilical de inmediato o esto puede demorar 1 o 2 minutos, segn la salud del beb. Este procedimiento puede variar segn el mdico, el hospital y el centro de parto.  Si usted y su beb estn lo suficientemente sanos, se le colocar el beb en el pecho o el abdomen para ayudar a mantener la temperatura del beb y el vnculo entre ustedes. Algunas madres y bebs comienzan la lactancia en este momento. Su equipo mdico secar al beb y lo ayudar a mantenerse caliente durante este tiempo.  Es posible que su beb necesite atencin   inmediata si: ? Mostr signos de sufrimiento durante el trabajo de parto. ? Tiene una afeccin mdica. ? Naci antes de tiempo (prematuro). ? Defeca antes del nacimiento (meconio). ? Muestra signos de dificultar en la transicin de estar dentro del tero a estar fuera del tero. Si tiene planeado amamantar, su equipo mdico la ayudar a comenzar la lactancia. Etapa 3  La tercera etapa del trabajo de parto comienza inmediatamente despus del nacimiento de su beb y finaliza despus de la expulsin de la placenta. La placenta es un rgano de que desarrolla durante el embarazo para proporcionar oxgeno y nutrientes al beb en el tero.  La expulsin de la placenta puede requerir algunos pujos y es posible que usted tenga contracciones leves. La lactancia puede estimular las contracciones para ayudar a expulsar la placenta.  Luego de la expulsin de la placenta, el tero debe (contraerse) y quedar muy firme. Esto ayuda a detener el sangrado en el tero. Para ayudar al tero a contraerse y controlar el sangrado, su mdico puede: ? Administrarle un medicamento inyectable, a travs de un tubo (catter) intravenoso, por boca o a travs del recto (por va rectal). ? Masajear el abdomen o realizar un examen de la vagina para extraer cualquier cogulo de sangre que quede en el tero. ? Vaciar la  vejiga colocando un tubo flexible (catter) en la vejiga. ? Alentarla a amamantar a su beb. Una vez que termina el trabajo de parto, se los controlar a usted y a su beb atentamente para tener la seguridad de que ambos estn sanos hasta que estn listos para ir a casa. Su equipo de atencin mdica le ensear cmo cuidarse y cuidar a su beb. Esta informacin no tiene como fin reemplazar el consejo del mdico. Asegrese de hacerle al mdico cualquier pregunta que tenga. Document Released: 08/24/2008 Document Revised: 12/21/2016 Document Reviewed: 09/26/2015 Elsevier Interactive Patient Education  2018 [employer]  

## 2018-04-25 NOTE — Progress Notes (Signed)
MD order for pt discharge home.  Pt given all d/c instructions and understands all.  F/u appt scheduled for pt since she needs an interpreter.  Baby to be discharged home with pt.

## 2018-04-25 NOTE — Progress Notes (Signed)
Pt viewed the DVD, "The Period of Purple Cry" in Spanish prior to discharge home.  DVD copy given to pt to take home as well.

## 2018-06-28 ENCOUNTER — Other Ambulatory Visit (HOSPITAL_COMMUNITY): Payer: Self-pay | Admitting: Family Medicine

## 2018-06-28 ENCOUNTER — Other Ambulatory Visit: Payer: Self-pay | Admitting: Family Medicine

## 2018-06-28 ENCOUNTER — Other Ambulatory Visit
Admission: RE | Admit: 2018-06-28 | Discharge: 2018-06-28 | Disposition: A | Discharge status: Home / Self Care | Payer: Self-pay | Source: Ambulatory Visit | Attending: Family Medicine | Admitting: Family Medicine

## 2018-06-28 ENCOUNTER — Ambulatory Visit
Admission: RE | Admit: 2018-06-28 | Discharge: 2018-06-28 | Disposition: A | Discharge status: Home / Self Care | Payer: Self-pay | Source: Ambulatory Visit | Attending: Family Medicine | Admitting: Family Medicine

## 2018-06-28 DIAGNOSIS — J9811 Atelectasis: Secondary | ICD-10-CM | POA: Insufficient documentation

## 2018-06-28 DIAGNOSIS — R042 Hemoptysis: Secondary | ICD-10-CM | POA: Insufficient documentation

## 2018-06-28 DIAGNOSIS — R7611 Nonspecific reaction to tuberculin skin test without active tuberculosis: Secondary | ICD-10-CM | POA: Insufficient documentation

## 2018-06-28 LAB — HM HIV SCREENING LAB: HM HIV Screening: NEGATIVE

## 2018-10-25 LAB — HEPATIC FUNCTION PANEL
ALT: 18 (ref 7–35)
AST: 17 (ref 13–35)
Alkaline Phosphatase: 112 (ref 25–125)
BILIRUBIN, TOTAL: 0.3

## 2018-11-14 DIAGNOSIS — R7611 Nonspecific reaction to tuberculin skin test without active tuberculosis: Secondary | ICD-10-CM

## 2018-11-14 DIAGNOSIS — Z62810 Personal history of physical and sexual abuse in childhood: Secondary | ICD-10-CM

## 2020-09-25 NOTE — L&D Delivery Note (Signed)
Delivery Note  First Stage: Labor onset: 1150 Augmentation : cytotec at 1150, Cooke catheter at 1513, pitocin at 1545 Analgesia /Anesthesia intrapartum: epidural AROM at 1606  Second Stage: Complete dilation at 1828 Onset of pushing at 1837 FHR second stage: category II, Variable decelerations  Delivery of a viable female 05/27/21 at 1843 by Andrey Campanile CNM delivery of fetal head in OP position. No nuchal cord;  Anterior then posterior shoulders delivered easily with gentle downward traction. Baby placed on mom's chest, and attended to by transition RN x 2 at bedside Cord double clamped at 60 seconds of life, cut by CNM, infant taken to warmer by NICU. Cord blood sample collected   Third Stage: Placenta delivered Tomasa Blase intact with 3 VC @ 1849 Placenta disposition: sent to pathology Uterine tone firm / bleeding scant  1st laceration identified  Anesthesia for repair: epidural Repair 3-0 Vicryl Est. Blood Loss (mL): 100  Complications: None  Mom to  stay in L&D for magnesium x24hrs for preeclampsia .  Baby to NICU.  Newborn: pending Info, infant to SCN at life Birth Weight: infant taken to NICU  Apgar Scores: infant taken to NICU Feeding planned: breast and bottle

## 2021-03-15 ENCOUNTER — Encounter: Payer: Self-pay | Admitting: Family Medicine

## 2021-03-15 ENCOUNTER — Ambulatory Visit: Payer: Medicaid Other | Admitting: Family Medicine

## 2021-03-15 ENCOUNTER — Other Ambulatory Visit: Payer: Self-pay

## 2021-03-15 ENCOUNTER — Other Ambulatory Visit: Payer: Self-pay | Admitting: Family Medicine

## 2021-03-15 VITALS — BP 119/73 | HR 69 | Temp 97.8°F | Ht <= 58 in | Wt 148.4 lb

## 2021-03-15 DIAGNOSIS — O0990 Supervision of high risk pregnancy, unspecified, unspecified trimester: Secondary | ICD-10-CM

## 2021-03-15 DIAGNOSIS — O09522 Supervision of elderly multigravida, second trimester: Secondary | ICD-10-CM

## 2021-03-15 DIAGNOSIS — B373 Candidiasis of vulva and vagina: Secondary | ICD-10-CM

## 2021-03-15 DIAGNOSIS — B3731 Acute candidiasis of vulva and vagina: Secondary | ICD-10-CM

## 2021-03-15 LAB — OB RESULTS CONSOLE GC/CHLAMYDIA
Chlamydia: NEGATIVE
Gonorrhea: NEGATIVE

## 2021-03-15 LAB — URINALYSIS
Bilirubin, UA: NEGATIVE
Glucose, UA: NEGATIVE
Ketones, UA: NEGATIVE
Nitrite, UA: NEGATIVE
Protein,UA: NEGATIVE
Specific Gravity, UA: 1.02 (ref 1.005–1.030)
Urobilinogen, Ur: 0.2 mg/dL (ref 0.2–1.0)
pH, UA: 5.5 (ref 5.0–7.5)

## 2021-03-15 LAB — WET PREP FOR TRICH, YEAST, CLUE: Trichomonas Exam: NEGATIVE

## 2021-03-15 LAB — HEMOGLOBIN, FINGERSTICK: Hemoglobin: 11.4 g/dL (ref 11.1–15.9)

## 2021-03-15 LAB — PREGNANCY, URINE: Preg Test, Ur: POSITIVE — AB

## 2021-03-15 MED ORDER — PRENATAL MULTIVITAMIN CH: 1.0000 | ORAL_TABLET | Freq: Every day | ORAL | 0 refills | Status: DC

## 2021-03-15 MED ORDER — CLOTRIMAZOLE 1 % VA CREA: 1.0000 | TOPICAL_CREAM | Freq: Every day | VAGINAL | 0 refills | Status: AC

## 2021-03-15 NOTE — Progress Notes (Signed)
Hgb, urine dip and wet prep reviewed with provider during clinic visit.   Wet prep treated per SO. clotrimazole (CLOTRIMAZOLE-7) 1 % vaginal cream given today.   PNV vitamins given today.   Floy Sabina, RN
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
## 2021-03-15 NOTE — Progress Notes (Signed)
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   06/28/18 - positive QFT with negative chest x-ray.  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

## 2021-03-16 LAB — CBC/D/PLT+RPR+RH+ABO+AB SCR
Antibody Screen: NEGATIVE
Basophils Absolute: 0.1 10*3/uL (ref 0.0–0.2)
Basos: 1 %
EOS (ABSOLUTE): 0.1 10*3/uL (ref 0.0–0.4)
Eos: 1 %
Hematocrit: 35.2 % (ref 34.0–46.6)
Hemoglobin: 11.2 g/dL (ref 11.1–15.9)
Hepatitis B Surface Ag: NEGATIVE
Immature Grans (Abs): 0.2 10*3/uL — ABNORMAL HIGH (ref 0.0–0.1)
Immature Granulocytes: 2 %
Lymphocytes Absolute: 1.7 10*3/uL (ref 0.7–3.1)
Lymphs: 15 %
MCH: 28.3 pg (ref 26.6–33.0)
MCHC: 31.8 g/dL (ref 31.5–35.7)
MCV: 89 fL (ref 79–97)
Monocytes Absolute: 0.5 10*3/uL (ref 0.1–0.9)
Monocytes: 5 %
Neutrophils Absolute: 8.4 10*3/uL — ABNORMAL HIGH (ref 1.4–7.0)
Neutrophils: 76 %
Platelets: 266 10*3/uL (ref 150–450)
RBC: 3.96 x10E6/uL (ref 3.77–5.28)
RDW: 13.3 % (ref 11.7–15.4)
RPR Ser Ql: NONREACTIVE
Rh Factor: POSITIVE
WBC: 10.9 10*3/uL — ABNORMAL HIGH (ref 3.4–10.8)

## 2021-03-16 LAB — 789231 7+OXYCODONE-BUND
Amphetamines, Urine: NEGATIVE ng/mL
BENZODIAZ UR QL: NEGATIVE ng/mL
Barbiturate screen, urine: NEGATIVE ng/mL
Cannabinoid Quant, Ur: NEGATIVE ng/mL
Cocaine (Metab.): NEGATIVE ng/mL
OPIATE SCREEN URINE: NEGATIVE ng/mL
Oxycodone/Oxymorphone, Urine: NEGATIVE ng/mL
PCP Quant, Ur: NEGATIVE ng/mL

## 2021-03-16 LAB — HIV-1/HIV-2 QUALITATIVE RNA
HIV-1 RNA, Qualitative: NONREACTIVE
HIV-2 RNA, Qualitative: NONREACTIVE

## 2021-03-16 LAB — HCV AB W REFLEX TO QUANT PCR: HCV Ab: <0.1 s/co ratio (ref 0.0–0.9)

## 2021-03-16 LAB — HCV INTERPRETATION

## 2021-03-17 DIAGNOSIS — O0990 Supervision of high risk pregnancy, unspecified, unspecified trimester: Secondary | ICD-10-CM | POA: Insufficient documentation

## 2021-03-17 LAB — IGP, APTIMA HPV
HPV Aptima: NEGATIVE
PAP Smear Comment: 0

## 2021-03-17 LAB — CHLAMYDIA/GC NAA, CONFIRMATION
Chlamydia trachomatis, NAA: NEGATIVE
Neisseria gonorrhoeae, NAA: NEGATIVE

## 2021-03-18 LAB — URINE CULTURE

## 2021-03-21 ENCOUNTER — Other Ambulatory Visit: Payer: Self-pay | Admitting: Family Medicine

## 2021-03-21 ENCOUNTER — Telehealth: Payer: Self-pay

## 2021-03-21 DIAGNOSIS — O2340 Unspecified infection of urinary tract in pregnancy, unspecified trimester: Secondary | ICD-10-CM

## 2021-03-21 MED ORDER — NITROFURANTOIN MONOHYD MACRO 100 MG PO CAPS: 100.0000 mg | ORAL_CAPSULE | Freq: Two times a day (BID) | ORAL | 0 refills | Status: DC

## 2021-03-21 NOTE — Progress Notes (Signed)
Order for [facility] for treatment of UTI   Wendi Snipes, FNP

## 2021-03-21 NOTE — Telephone Encounter (Signed)
TC to patient to inform of UTI and need for treatment appointment. Patient states understanding and is scheduled for 03/22/2021..Interpreter from [place] interpreters, ID # [phone].Marland KitchenMarland KitchenMarland KitchenMemory Dance, RN

## 2021-03-22 ENCOUNTER — Other Ambulatory Visit: Payer: Self-pay

## 2021-03-22 ENCOUNTER — Ambulatory Visit: Payer: Medicaid Other | Admitting: Family Medicine

## 2021-03-22 VITALS — BP 133/75 | HR 69 | Temp 97.5°F | Wt 148.1 lb

## 2021-03-22 DIAGNOSIS — O2342 Unspecified infection of urinary tract in pregnancy, second trimester: Secondary | ICD-10-CM

## 2021-03-22 DIAGNOSIS — O0990 Supervision of high risk pregnancy, unspecified, unspecified trimester: Secondary | ICD-10-CM

## 2021-03-22 DIAGNOSIS — O09522 Supervision of elderly multigravida, second trimester: Secondary | ICD-10-CM

## 2021-03-22 DIAGNOSIS — O2340 Unspecified infection of urinary tract in pregnancy, unspecified trimester: Secondary | ICD-10-CM | POA: Insufficient documentation

## 2021-03-22 DIAGNOSIS — O0930 Supervision of pregnancy with insufficient antenatal care, unspecified trimester: Secondary | ICD-10-CM

## 2021-03-22 DIAGNOSIS — O0932 Supervision of pregnancy with insufficient antenatal care, second trimester: Secondary | ICD-10-CM

## 2021-03-22 MED ORDER — NITROFURANTOIN MONOHYD MACRO 100 MG PO CAPS: 100.0000 mg | ORAL_CAPSULE | Freq: Two times a day (BID) | ORAL | 0 refills | Status: AC

## 2021-03-22 NOTE — Progress Notes (Addendum)
   PRENATAL VISIT NOTE  Subjective:  Amber Stark is a 40 y.o. [phone] at [redacted]w[redacted]d being seen today for ongoing prenatal care.  She is currently monitored for the following issues for this high-risk pregnancy and has Advanced maternal age in multigravida, second trimester; Nonspecific reaction to tuberculin test; Supervision of high risk pregnancy, antepartum; and Urinary tract infection affecting pregnancy, antepartum on their problem list.  Patient reports no complaints.  Contractions: Not present. Vag. Bleeding: None.  Movement: Present. Denies leaking of fluid/ROM.   The following portions of the patient's history were reviewed and updated as appropriate: allergies, current medications, past family history, past medical history, past social history, past surgical history and problem list. Problem list updated.  Objective:   Vitals:   03/22/21 0851  BP: 133/75  Pulse: 69  Temp: (!) 97.5 F (36.4 C)  Weight: 148 lb 1.6 oz (67.2 kg)    Fetal Status: Fetal Heart Rate (bpm): 155 Fundal Height: 26 cm Movement: Present  Presentation: Vertex  General:  Alert, oriented and cooperative. Patient is in no acute distress.  Skin: Skin is warm and dry. No rash noted.   Cardiovascular: Normal heart rate noted  Respiratory: Normal respiratory effort, no problems with respiration noted  Abdomen: Soft, gravid, appropriate for gestational age.  Pain/Pressure: Absent     Pelvic: Cervical exam deferred        Extremities: Normal range of motion.     Mental Status: Normal mood and affect. Normal behavior. Normal judgment and thought content.   Assessment and Plan:  Pregnancy: G7P6006 at [redacted]w[redacted]d  1. Supervision of high risk pregnancy, antepartum Up to date Established care late at 23 wks [place] scheduled 8/9 for anatomy scan  2. Urinary tract infection affecting pregnancy, antepartum Reviewed UCX which showed mixed GU flora, > 100K. Will treat with macrobid. Needs TOC at next  visit.   The patient was dispensed Macrobid today. I provided counseling today regarding the medication. We discussed the medication, the side effects and when to call clinic. Patient given the opportunity to ask questions. Questions answered.    - nitrofurantoin, macrocrystal-monohydrate, (MACROBID) 100 MG capsule; Take 1 capsule (100 mg total) by mouth 2 (two) times daily for 7 days.  Dispense: 14 capsule; Refill: 0   Preterm labor symptoms and general obstetric precautions including but not limited to vaginal bleeding, contractions, leaking of fluid and fetal movement were reviewed in detail with the patient. Please refer to After Visit Summary for other counseling recommendations.   Return in about 2 weeks (around 04/05/2021) for Scheduled prenatal visit.  Future Appointments  Date Time Provider Department Center  04/05/2021  8:40 AM [facility] PROVIDER AC-[facility] None  05/03/2021 10:00 AM [facility]    Federico Flake, MD

## 2021-03-22 NOTE — Progress Notes (Signed)
Here today for 24.5 week OB problem. Needs UTI Tx. Taking PNV QD. Denies ED/hospital visits since last appt. [facility] [place] appt for 05/03/2021 @ 10:00 given and explained to patient. Tawny Hopping, RN

## 2021-03-26 NOTE — Progress Notes (Signed)
Chart reviewed by Pharmacist  Suzanne Walker PharmD, Contract Pharmacist at [facility]  

## 2021-03-29 NOTE — Addendum Note (Signed)
Addended by: Heywood Bene on: 03/29/2021 11:53 AM   Modules accepted: Orders

## 2021-04-05 ENCOUNTER — Other Ambulatory Visit: Payer: Self-pay

## 2021-04-05 ENCOUNTER — Ambulatory Visit: Payer: Medicaid Other | Admitting: Advanced Practice Midwife

## 2021-04-05 VITALS — BP 129/79 | HR 65 | Temp 97.0°F | Wt 151.2 lb

## 2021-04-05 DIAGNOSIS — O0930 Supervision of pregnancy with insufficient antenatal care, unspecified trimester: Secondary | ICD-10-CM

## 2021-04-05 DIAGNOSIS — O0990 Supervision of high risk pregnancy, unspecified, unspecified trimester: Secondary | ICD-10-CM

## 2021-04-05 DIAGNOSIS — O2340 Unspecified infection of urinary tract in pregnancy, unspecified trimester: Secondary | ICD-10-CM

## 2021-04-05 DIAGNOSIS — O09522 Supervision of elderly multigravida, second trimester: Secondary | ICD-10-CM

## 2021-04-05 LAB — URINALYSIS
Bilirubin, UA: NEGATIVE
Glucose, UA: NEGATIVE
Ketones, UA: NEGATIVE
Leukocytes,UA: NEGATIVE
Nitrite, UA: NEGATIVE
Protein,UA: NEGATIVE
RBC, UA: NEGATIVE
Specific Gravity, UA: 1.015 (ref 1.005–1.030)
Urobilinogen, Ur: 0.2 mg/dL (ref 0.2–1.0)
pH, UA: 5.5 (ref 5.0–7.5)

## 2021-04-05 NOTE — Progress Notes (Signed)
   PRENATAL VISIT NOTE  Subjective:  Amber Stark Amber Stark is a 40 y.o. [phone] at [redacted]w[redacted]d being seen today for ongoing prenatal care.  She is currently monitored for the following issues for this high-risk pregnancy and has Advanced maternal age in multigravida, 40 yo; Nonspecific reaction to tuberculin test; Supervision of high risk pregnancy, antepartum; Urinary tract infection affecting pregnancy, 03/15/21 >100,013mixed flora; and Late prenatal care 23 5/7 wks on their problem list.  Patient reports no complaints.  Contractions: Not present. Vag. Bleeding: None.  Movement: Present. Denies leaking of fluid/ROM.   The following portions of the patient's history were reviewed and updated as appropriate: allergies, current medications, past family history, past medical history, past social history, past surgical history and problem list. Problem list updated.  Objective:   Vitals:   04/05/21 0831  BP: 129/79  Pulse: 65  Temp: (!) 97 F (36.1 C)  Weight: 151 lb 3.2 oz (68.6 kg)    Fetal Status: Fetal Heart Rate (bpm): 130 Fundal Height: 28 cm Movement: Present  Presentation: Complete Breech  General:  Alert, oriented and cooperative. Patient is in no acute distress.  Skin: Skin is warm and dry. No rash noted.   Cardiovascular: Normal heart rate noted  Respiratory: Normal respiratory effort, no problems with respiration noted  Abdomen: Soft, gravid, appropriate for gestational age.  Pain/Pressure: Absent     Pelvic: Cervical exam deferred        Extremities: Normal range of motion.  Edema: None  Mental Status: Normal mood and affect. Normal behavior. Normal judgment and thought content.   Assessment and Plan:  Pregnancy: G7P6006 at [redacted]w[redacted]d  1. Advanced maternal age in multigravida, 40 yo Initiated care too late to begin ASA  2. Supervision of high risk pregnancy, antepartum Doesn't want any more children; discussed birth control options 21 lb 3.2 oz (9.616 kg) Not  working; living with husband and 6 kids Reminded of first u/s apt 05/03/21 133/75 on 03/22/21 and 129/79 today, h/a 1x/wk resolved spontaneously; monitor closely Needs 1 hour glucola in 2 wks - Urine Culture - Urinalysis (Urine Dip)  3. Urinary tract infection affecting pregnancy, 03/15/21 >100,034mixed flora Treated with Macrobid on 03/22/21; C&S TOC today - Urine Culture - Urinalysis (Urine Dip)  4. Late prenatal care 23 5/7 wks    Preterm labor symptoms and general obstetric precautions including but not limited to vaginal bleeding, contractions, leaking of fluid and fetal movement were reviewed in detail with the patient. Please refer to After Visit Summary for other counseling recommendations.  Return in about 2 weeks (around 04/19/2021) for 28 week labs, routine PNC.  Future Appointments  Date Time Provider Department Center  05/03/2021 10:00 AM [facility]    Alberteen Spindle, CNM

## 2021-04-05 NOTE — Assessment & Plan Note (Signed)
[ ]   counseled about Genetic screening (NIPT preferred method for >39yo) [ ]  Detailed at 18-20 wks  If mom will be >= 40 at delivery [ ]  NST/AFI weekly starting at 36 wk [ ]  Kick counts [ ]  IOL at 40 wks

## 2021-04-05 NOTE — Progress Notes (Signed)
Here today for 26.5 week MH RV. Taking PNV QD. Denies ED/hospital visits since last appt. Aware of [facility] appt for 05/03/2021 @ 10:00. Tawny Hopping, RN

## 2021-04-07 LAB — URINE CULTURE

## 2021-04-19 ENCOUNTER — Ambulatory Visit: Payer: Medicaid Other | Admitting: Family Medicine

## 2021-04-19 ENCOUNTER — Other Ambulatory Visit: Payer: Self-pay

## 2021-04-19 VITALS — BP 125/80 | HR 73 | Temp 96.9°F | Wt 152.6 lb

## 2021-04-19 DIAGNOSIS — O09523 Supervision of elderly multigravida, third trimester: Secondary | ICD-10-CM

## 2021-04-19 DIAGNOSIS — Z23 Encounter for immunization: Secondary | ICD-10-CM | POA: Diagnosis not present

## 2021-04-19 DIAGNOSIS — O09522 Supervision of elderly multigravida, second trimester: Secondary | ICD-10-CM

## 2021-04-19 DIAGNOSIS — O0990 Supervision of high risk pregnancy, unspecified, unspecified trimester: Secondary | ICD-10-CM

## 2021-04-19 LAB — HEMOGLOBIN, FINGERSTICK: Hemoglobin: 12.3 g/dL (ref 11.1–15.9)

## 2021-04-19 NOTE — Progress Notes (Signed)
   PRENATAL VISIT NOTE  Subjective:  Amber Stark is a 40 y.o. [phone] at [redacted]w[redacted]d being seen today for ongoing prenatal care.  She is currently monitored for the following issues for this high-risk pregnancy and has Advanced maternal age in multigravida, 40 yo; Nonspecific reaction to tuberculin test; Supervision of high risk pregnancy, antepartum; Urinary tract infection affecting pregnancy, 03/15/21 >100,060mixed flora; and Late prenatal care 23 5/7 wks on their problem list.  Patient reports  edema in legs .  Contractions: Not present. Vag. Bleeding: None.  Movement: Present. Denies leaking of fluid/ROM.   The following portions of the patient's history were reviewed and updated as appropriate: allergies, current medications, past family history, past medical history, past social history, past surgical history and problem list. Problem list updated.  Objective:   Vitals:   04/19/21 0841  BP: 125/80  Pulse: 73  Temp: (!) 96.9 F (36.1 C)  Weight: 152 lb 9.6 oz (69.2 kg)    Fetal Status: Fetal Heart Rate (bpm): 132 Fundal Height: 31 cm Movement: Present     General:  Alert, oriented and cooperative. Patient is in no acute distress.  Skin: Skin is warm and dry. No rash noted.   Cardiovascular: Normal heart rate noted  Respiratory: Normal respiratory effort, no problems with respiration noted  Abdomen: Soft, gravid, appropriate for gestational age.  Pain/Pressure: Absent     Pelvic: Cervical exam deferred        Extremities: Normal range of motion.  Edema: Trace  Mental Status: Normal mood and affect. Normal behavior. Normal judgment and thought content.   Assessment and Plan:  Pregnancy: G7P6006 at [redacted]w[redacted]d  1. Supervision of high risk pregnancy, antepartum -28 wk labs today. Hgb ordered  -Reviewed prenatal visit schedule q2 wks until 36 wks, then q1 wk.  Discussed walking for exercise. Pt reports swelling in legs.    Discuss prevention of edema in legs  such as  low sodium, no added salt after food is prepared, exercise, compression sock and sturdy shoes such as sneakers with walking.  No edema noted today.  Will reassess at next visit.    - Glucose, 1 hour gestational - HIV-1/HIV-2 Qualitative RNA - RPR - Hemoglobin, venipuncture  2. Advanced maternal age in multigravida, 40 yo Not taking ASA d/t late into prenatal care.     Preterm labor symptoms and general obstetric precautions including but not limited to vaginal bleeding, contractions, leaking of fluid and fetal movement were reviewed in detail with the patient. Please refer to After Visit Summary for other counseling recommendations.  Return in about 2 weeks (around 05/03/2021) for routine prenatal care.  Future Appointments  Date Time Provider Department Center  05/03/2021 10:00 AM [facility]   V. [place] used for [place] interpretation.     Wendi Snipes, FNP

## 2021-04-19 NOTE — Progress Notes (Signed)
Here today for 28.5 week MH RV. Taking PNV QD. Denies ED/hospital visits since last RV. Has scheduled MFM anatomy scan 05/03/2021 @ 10:00 and plans to keep. 28 week labs and Tdap today. Tdap given and tolerated well. Tawny Hopping, RN

## 2021-04-20 ENCOUNTER — Telehealth: Payer: Self-pay

## 2021-04-20 DIAGNOSIS — O99810 Abnormal glucose complicating pregnancy: Secondary | ICD-10-CM | POA: Insufficient documentation

## 2021-04-20 LAB — HIV-1/HIV-2 QUALITATIVE RNA
HIV-1 RNA, Qualitative: NONREACTIVE
HIV-2 RNA, Qualitative: NONREACTIVE

## 2021-04-20 LAB — GLUCOSE, 1 HOUR GESTATIONAL: Gestational Diabetes Screen: 174 mg/dL — ABNORMAL HIGH (ref 65–139)

## 2021-04-20 LAB — RPR: RPR Ser Ql: NONREACTIVE

## 2021-04-20 NOTE — Telephone Encounter (Signed)
1 hr glucola = 174 and needs 3 hr gtt. Call to client with [facility] [employer] ID [phone] andper recorded message, voicemail has not been set up (pink sticky noted to request client do this). Call to emergency contact (spouse) who states will contact spouse and have her call [facility]. Jossie Ng, RN

## 2021-04-21 NOTE — Telephone Encounter (Signed)
TC to patient to inform of abnormal results from 1 hour gtt and need for 3 hour gtt. Procedure for fasting explained and patient scheduled for 04/22/2021 and told to arrive at 0800. Patient counseled to finish eating before 8pm tonight and not to eat or drink, no gum or candy after 8pm. Sips of water ok. Patient counseled about procedure for her appointment and that she will be here for most of the morning and can leave after the final blood draw. Patient states understanding and denies questions at this time. Interpreter, Juliene Pina.Burt Knack, RN

## 2021-04-22 ENCOUNTER — Other Ambulatory Visit: Payer: Medicaid Other

## 2021-04-22 ENCOUNTER — Other Ambulatory Visit: Payer: Self-pay

## 2021-04-22 DIAGNOSIS — O99810 Abnormal glucose complicating pregnancy: Secondary | ICD-10-CM

## 2021-04-22 NOTE — Progress Notes (Signed)
In Nurse Clinic for 3 hr GTT. Reports npo since 7 pm last night (04/21/2021). Instructions for test reviewed with pt. Advised to notify RN if n/v. Pt may leave after last lab draw. Questions answered and reports understanding. Juliene Pina, interpreter. Jerel Shepherd, RN

## 2021-04-23 LAB — GLUCOSE TOLERANCE TEST, 6 HOUR
Glucose, 1 Hour GTT: 182 mg/dL (ref 65–199)
Glucose, 2 hour: 141 mg/dL — ABNORMAL HIGH (ref 65–139)
Glucose, 3 hour: 117 mg/dL — ABNORMAL HIGH (ref 65–109)
Glucose, GTT - Fasting: 65 mg/dL (ref 65–99)

## 2021-04-25 ENCOUNTER — Other Ambulatory Visit: Payer: Self-pay | Admitting: Advanced Practice Midwife

## 2021-04-25 DIAGNOSIS — O99810 Abnormal glucose complicating pregnancy: Secondary | ICD-10-CM

## 2021-04-29 ENCOUNTER — Other Ambulatory Visit: Payer: Self-pay | Admitting: Family Medicine

## 2021-04-29 DIAGNOSIS — O0993 Supervision of high risk pregnancy, unspecified, third trimester: Secondary | ICD-10-CM

## 2021-04-29 DIAGNOSIS — O0943 Supervision of pregnancy with grand multiparity, third trimester: Secondary | ICD-10-CM

## 2021-04-29 DIAGNOSIS — O99810 Abnormal glucose complicating pregnancy: Secondary | ICD-10-CM

## 2021-04-29 DIAGNOSIS — Z3A30 30 weeks gestation of pregnancy: Secondary | ICD-10-CM

## 2021-04-29 DIAGNOSIS — O09523 Supervision of elderly multigravida, third trimester: Secondary | ICD-10-CM

## 2021-05-03 ENCOUNTER — Ambulatory Visit: Payer: Self-pay | Attending: Family Medicine

## 2021-05-03 ENCOUNTER — Other Ambulatory Visit: Payer: Self-pay

## 2021-05-03 VITALS — BP 135/79 | HR 76 | Temp 98.1°F | Wt 157.5 lb

## 2021-05-03 DIAGNOSIS — O0933 Supervision of pregnancy with insufficient antenatal care, third trimester: Secondary | ICD-10-CM

## 2021-05-03 DIAGNOSIS — O09523 Supervision of elderly multigravida, third trimester: Secondary | ICD-10-CM | POA: Insufficient documentation

## 2021-05-03 DIAGNOSIS — O0943 Supervision of pregnancy with grand multiparity, third trimester: Secondary | ICD-10-CM | POA: Insufficient documentation

## 2021-05-03 DIAGNOSIS — O0993 Supervision of high risk pregnancy, unspecified, third trimester: Secondary | ICD-10-CM | POA: Insufficient documentation

## 2021-05-03 DIAGNOSIS — O99810 Abnormal glucose complicating pregnancy: Secondary | ICD-10-CM | POA: Insufficient documentation

## 2021-05-03 DIAGNOSIS — Z3A30 30 weeks gestation of pregnancy: Secondary | ICD-10-CM | POA: Insufficient documentation

## 2021-05-06 ENCOUNTER — Other Ambulatory Visit: Payer: Self-pay

## 2021-05-06 ENCOUNTER — Ambulatory Visit: Payer: Self-pay | Admitting: Advanced Practice Midwife

## 2021-05-06 ENCOUNTER — Ambulatory Visit: Payer: Self-pay

## 2021-05-06 VITALS — BP 137/83 | HR 67 | Temp 97.1°F | Wt 153.6 lb

## 2021-05-06 DIAGNOSIS — O09522 Supervision of elderly multigravida, second trimester: Secondary | ICD-10-CM

## 2021-05-06 DIAGNOSIS — O99810 Abnormal glucose complicating pregnancy: Secondary | ICD-10-CM

## 2021-05-06 DIAGNOSIS — O99210 Obesity complicating pregnancy, unspecified trimester: Secondary | ICD-10-CM | POA: Insufficient documentation

## 2021-05-06 DIAGNOSIS — O0990 Supervision of high risk pregnancy, unspecified, unspecified trimester: Secondary | ICD-10-CM

## 2021-05-06 LAB — URINALYSIS
Bilirubin, UA: NEGATIVE
Glucose, UA: NEGATIVE
Leukocytes,UA: NEGATIVE
Nitrite, UA: NEGATIVE
Protein,UA: NEGATIVE
RBC, UA: NEGATIVE
Specific Gravity, UA: 1.025 (ref 1.005–1.030)
Urobilinogen, Ur: 0.2 mg/dL (ref 0.2–1.0)
pH, UA: 6 (ref 5.0–7.5)

## 2021-05-06 MED ORDER — PRENATAL VITAMIN 27-0.8 MG PO TABS: 1.0000 | ORAL_TABLET | Freq: Every day | ORAL | 0 refills | Status: AC

## 2021-05-06 NOTE — Progress Notes (Addendum)
   PRENATAL VISIT NOTE  Subjective:  Amber Stark Amber Stark is a 40 y.o. [id number] at [redacted]w[redacted]d being seen today for ongoing prenatal care.  She is currently monitored for the following issues for this high-risk pregnancy and has Advanced maternal age in multigravida, 40 yo; Nonspecific reaction to tuberculin test; Supervision of high risk pregnancy, antepartum; Urinary tract infection affecting pregnancy, 03/15/21 >100,043mixed flora; Late prenatal care 23 5/7 wks; and Abnormal glucose tolerance test in pregnancy on their problem list.  Patient reports no complaints.  Contractions: Not present. Vag. Bleeding: None.  Movement: Present. Denies leaking of fluid/ROM.   The following portions of the patient's history were reviewed and updated as appropriate: allergies, current medications, past family history, past medical history, past social history, past surgical history and problem list. Problem list updated.  Objective:   Vitals:   05/06/21 0837  BP: 137/83  Pulse: 67  Temp: (!) 97.1 F (36.2 C)  Weight: 153 lb 9.6 oz (69.7 kg)    Fetal Status: Fetal Heart Rate (bpm): 140 Fundal Height: 33 cm Movement: Present     General:  Alert, oriented and cooperative. Patient is in no acute distress.  Skin: Skin is warm and dry. No rash noted.   Cardiovascular: Normal heart rate noted  Respiratory: Normal respiratory effort, no problems with respiration noted  Abdomen: Soft, gravid, appropriate for gestational age.  Pain/Pressure: Absent     Pelvic: Cervical exam deferred        Extremities: Normal range of motion.  Edema: None  Mental Status: Normal mood and affect. Normal behavior. Normal judgment and thought content.   Assessment and Plan:  Pregnancy: G7P6006 at [redacted]w[redacted]d  1. Advanced maternal age in multigravida, 40 yo  - Urinalysis (Urine Dip)  2. Abnormal glucose tolerance test in pregnancy AMA diet counseling done 04/26/21 due to equivocal 3 hour GTT on 04/22/21  3. Supervision  of high risk pregnancy, antepartum 23 lb 9.6 oz (10.7 kg) Not taking ASA 81 mg daily due to late entry to care BP 137/83. Denies h/a, scotoma, epigastric pain. Urine dip today Not working   Preterm labor symptoms and general obstetric precautions including but not limited to vaginal bleeding, contractions, leaking of fluid and fetal movement were reviewed in detail with the patient. Please refer to After Visit Summary for other counseling recommendations.  Return in about 2 weeks (around 05/20/2021) for routine PNC.  Future Appointments  Date Time Provider Department Center  05/31/2021 11:30 AM [facility]    Alberteen Spindle, CNM Patient here for [facility] RV at 31 1/7. Kick counts reviewed and cards given. Needs PNV today.Burt Knack, RN

## 2021-05-06 NOTE — Progress Notes (Signed)
PNV given. Urine dip reviewed and patient counseled to drink lots of water, per provider.Burt Knack, RN

## 2021-05-09 NOTE — Progress Notes (Signed)
Pap smear is negative with negative hpv. Next pap in 5 years.

## 2021-05-11 ENCOUNTER — Encounter: Payer: Self-pay | Admitting: Nurse Practitioner

## 2021-05-11 NOTE — Progress Notes (Signed)
PAP normal, HPV negative.  Repeat PAP in 5 years (02/2026) per Lyndel Safe, MD. PAP card mailed today.  MyCHART account not active. Glenna Fellows, RN

## 2021-05-20 ENCOUNTER — Encounter: Payer: Self-pay | Admitting: Obstetrics and Gynecology

## 2021-05-20 ENCOUNTER — Observation Stay: Payer: Self-pay

## 2021-05-20 ENCOUNTER — Ambulatory Visit: Payer: Self-pay | Admitting: Family Medicine

## 2021-05-20 ENCOUNTER — Observation Stay
Admission: EM | Admit: 2021-05-20 | Discharge: 2021-05-21 | Disposition: A | Discharge status: Home / Self Care | Payer: Self-pay | Attending: Certified Nurse Midwife | Admitting: Certified Nurse Midwife

## 2021-05-20 ENCOUNTER — Other Ambulatory Visit: Payer: Self-pay

## 2021-05-20 VITALS — BP 178/98 | HR 77 | Temp 97.1°F | Wt 157.4 lb

## 2021-05-20 DIAGNOSIS — E669 Obesity, unspecified: Secondary | ICD-10-CM | POA: Insufficient documentation

## 2021-05-20 DIAGNOSIS — R030 Elevated blood-pressure reading, without diagnosis of hypertension: Secondary | ICD-10-CM | POA: Insufficient documentation

## 2021-05-20 DIAGNOSIS — O163 Unspecified maternal hypertension, third trimester: Secondary | ICD-10-CM | POA: Diagnosis present

## 2021-05-20 DIAGNOSIS — O0990 Supervision of high risk pregnancy, unspecified, unspecified trimester: Secondary | ICD-10-CM

## 2021-05-20 DIAGNOSIS — O09522 Supervision of elderly multigravida, second trimester: Secondary | ICD-10-CM

## 2021-05-20 DIAGNOSIS — O99810 Abnormal glucose complicating pregnancy: Secondary | ICD-10-CM

## 2021-05-20 DIAGNOSIS — O09523 Supervision of elderly multigravida, third trimester: Principal | ICD-10-CM | POA: Insufficient documentation

## 2021-05-20 DIAGNOSIS — O133 Gestational [pregnancy-induced] hypertension without significant proteinuria, third trimester: Secondary | ICD-10-CM | POA: Insufficient documentation

## 2021-05-20 DIAGNOSIS — O99213 Obesity complicating pregnancy, third trimester: Secondary | ICD-10-CM | POA: Insufficient documentation

## 2021-05-20 DIAGNOSIS — O0930 Supervision of pregnancy with insufficient antenatal care, unspecified trimester: Secondary | ICD-10-CM

## 2021-05-20 DIAGNOSIS — Z3A33 33 weeks gestation of pregnancy: Secondary | ICD-10-CM | POA: Insufficient documentation

## 2021-05-20 LAB — URINALYSIS
Bilirubin, UA: NEGATIVE
Glucose, UA: NEGATIVE
Ketones, UA: NEGATIVE
Leukocytes,UA: NEGATIVE
Nitrite, UA: NEGATIVE
Protein,UA: NEGATIVE
RBC, UA: NEGATIVE
Specific Gravity, UA: 1.01 (ref 1.005–1.030)
Urobilinogen, Ur: 0.2 mg/dL (ref 0.2–1.0)
pH, UA: 6 (ref 5.0–7.5)

## 2021-05-20 LAB — CBC WITH DIFFERENTIAL/PLATELET
Abs Immature Granulocytes: 0.19 10*3/uL — ABNORMAL HIGH (ref 0.00–0.07)
Basophils Absolute: 0.1 10*3/uL (ref 0.0–0.1)
Basophils Relative: 1 %
Eosinophils Absolute: 0 10*3/uL (ref 0.0–0.5)
Eosinophils Relative: 0 %
HCT: 37.3 % (ref 36.0–46.0)
Hemoglobin: 12.4 g/dL (ref 12.0–15.0)
Immature Granulocytes: 2 %
Lymphocytes Relative: 13 %
Lymphs Abs: 1.4 10*3/uL (ref 0.7–4.0)
MCH: 29.6 pg (ref 26.0–34.0)
MCHC: 33.2 g/dL (ref 30.0–36.0)
MCV: 89 fL (ref 80.0–100.0)
Monocytes Absolute: 0.6 10*3/uL (ref 0.1–1.0)
Monocytes Relative: 5 %
Neutro Abs: 8.5 10*3/uL — ABNORMAL HIGH (ref 1.7–7.7)
Neutrophils Relative %: 79 %
Platelets: 235 10*3/uL (ref 150–400)
RBC: 4.19 MIL/uL (ref 3.87–5.11)
RDW: 16.4 % — ABNORMAL HIGH (ref 11.5–15.5)
WBC: 10.7 10*3/uL — ABNORMAL HIGH (ref 4.0–10.5)
nRBC: 0 % (ref 0.0–0.2)

## 2021-05-20 LAB — COMPREHENSIVE METABOLIC PANEL
ALT: 21 U/L (ref 0–44)
AST: 29 U/L (ref 15–41)
Albumin: 3 g/dL — ABNORMAL LOW (ref 3.5–5.0)
Alkaline Phosphatase: 121 U/L (ref 38–126)
Anion gap: 8 (ref 5–15)
BUN: 6 mg/dL (ref 6–20)
CO2: 22 mmol/L (ref 22–32)
Calcium: 8.2 mg/dL — ABNORMAL LOW (ref 8.9–10.3)
Chloride: 106 mmol/L (ref 98–111)
Creatinine, Ser: 0.44 mg/dL (ref 0.44–1.00)
GFR, Estimated: >60 mL/min (ref >60)
Glucose, Bld: 87 mg/dL (ref 70–99)
Potassium: 3.4 mmol/L — ABNORMAL LOW (ref 3.5–5.1)
Sodium: 136 mmol/L (ref 135–145)
Total Bilirubin: 0.3 mg/dL (ref 0.3–1.2)
Total Protein: 6.8 g/dL (ref 6.5–8.1)

## 2021-05-20 LAB — PROTEIN / CREATININE RATIO, URINE
Creatinine, Urine: <10 mg/dL
Total Protein, Urine: <6 mg/dL

## 2021-05-20 MED ORDER — ACETAMINOPHEN 325 MG PO TABS: 650.0000 mg | ORAL_TABLET | ORAL | Status: DC | PRN

## 2021-05-20 MED ORDER — LABETALOL HCL 5 MG/ML IV SOLN
20.0000 mg | INTRAVENOUS | Status: DC | PRN
  Filled 2021-05-20: qty 4

## 2021-05-20 MED ORDER — LABETALOL HCL 5 MG/ML IV SOLN: 80.0000 mg | INTRAVENOUS | Status: DC | PRN

## 2021-05-20 MED ORDER — BETAMETHASONE SOD PHOS & ACET 6 (3-3) MG/ML IJ SUSP
12.0000 mg | INTRAMUSCULAR | Status: AC
  Administered 2021-05-20 – 2021-05-21 (×2): 12 mg via INTRAMUSCULAR
  Filled 2021-05-20: qty 5

## 2021-05-20 MED ORDER — LABETALOL HCL 5 MG/ML IV SOLN: 40.0000 mg | INTRAVENOUS | Status: DC | PRN

## 2021-05-20 MED ORDER — ONDANSETRON HCL 4 MG/2ML IJ SOLN
4.0000 mg | Freq: Four times a day (QID) | INTRAMUSCULAR | Status: DC | PRN
Start: 2021-05-20 — End: 2021-05-22

## 2021-05-20 MED ORDER — HYDRALAZINE HCL 20 MG/ML IJ SOLN: 10.0000 mg | INTRAMUSCULAR | Status: DC | PRN

## 2021-05-20 NOTE — Discharge Summary (Signed)
Patient ID: [facility] Rudi Coco MRN: [id number] DOB/AGE: 1980-10-26 40 y.o.  Admit date: 05/20/2021 Discharge date: 05/21/2021  Admission Diagnoses: 39yo G7P6 at 62w1dsent from the [facility] clinic for elevated BPs.  The clinic noted BPs of 165/93, 161/91 and 178/98.  Denies s/s of Pre-E.  Discharge Diagnoses: Gestational hypertension at 33 weeks  Factors complicating pregnancy: AMA Obesity Abnormal glucose test  Prenatal Procedures: NST  Consults: None  Significant Diagnostic Studies:  Results for orders placed or performed during the hospital encounter of 05/20/21 (from the past 168 hour(s))  Protein / creatinine ratio, urine   Collection Time: 05/20/21 11:39 AM  Result Value Ref Range   Creatinine, Urine <10 mg/dL   Total Protein, Urine <6 mg/dL   Protein Creatinine Ratio        0.00 - 0.15 mg/mg[Cre]  Comprehensive metabolic panel   Collection Time: 05/20/21 11:48 AM  Result Value Ref Range   Sodium 136 135 - 145 mmol/L   Potassium 3.4 (L) 3.5 - 5.1 mmol/L   Chloride 106 98 - 111 mmol/L   CO2 22 22 - 32 mmol/L   Glucose, Bld 87 70 - 99 mg/dL   BUN 6 6 - 20 mg/dL   Creatinine, Ser 0.44 0.44 - 1.00 mg/dL   Calcium 8.2 (L) 8.9 - 10.3 mg/dL   Total Protein 6.8 6.5 - 8.1 g/dL   Albumin 3.0 (L) 3.5 - 5.0 g/dL   AST 29 15 - 41 U/L   ALT 21 0 - 44 U/L   Alkaline Phosphatase 121 38 - 126 U/L   Total Bilirubin 0.3 0.3 - 1.2 mg/dL   GFR, Estimated >60 >60 mL/min   Anion gap 8 5 - 15  CBC with Differential   Collection Time: 05/20/21 11:48 AM  Result Value Ref Range   WBC 10.7 (H) 4.0 - 10.5 K/uL   RBC 4.19 3.87 - 5.11 MIL/uL   Hemoglobin 12.4 12.0 - 15.0 g/dL   HCT 37.3 36.0 - 46.0 %   MCV 89.0 80.0 - 100.0 fL   MCH 29.6 26.0 - 34.0 pg   MCHC 33.2 30.0 - 36.0 g/dL   RDW 16.4 (H) 11.5 - 15.5 %   Platelets 235 150 - 400 K/uL   nRBC 0.0 0.0 - 0.2 %   Neutrophils Relative % 79 %   Neutro Abs 8.5 (H) 1.7 - 7.7 K/uL   Lymphocytes Relative 13 %   Lymphs Abs  1.4 0.7 - 4.0 K/uL   Monocytes Relative 5 %   Monocytes Absolute 0.6 0.1 - 1.0 K/uL   Eosinophils Relative 0 %   Eosinophils Absolute 0.0 0.0 - 0.5 K/uL   Basophils Relative 1 %   Basophils Absolute 0.1 0.0 - 0.1 K/uL   Immature Granulocytes 2 %   Abs Immature Granulocytes 0.19 (H) 0.00 - 0.07 K/uL  Results for orders placed or performed in visit on 05/20/21 (from the past 168 hour(s))  PIH Panel ([facility] [id number]   Collection Time: 05/20/21  9:00 AM  Result Value Ref Range   Uric Acid 4.9 2.6 - 6.2 mg/dL   BUN 6 6 - 20 mg/dL   Creatinine, Ser 0.58 0.57 - 1.00 mg/dL   eGFR 119 >59 mL/min/1.73   LDH 164 119 - 226 IU/L   AST 23 0 - 40 IU/L   Hemoglobin 12.5 11.1 - 15.9 g/dL   Hematocrit 38.8 34.0 - 46.6 %   Platelets 247 150 - 450 x10E3/uL  Urinalysis (Urine Dip)  Collection Time: 05/20/21 11:25 AM  Result Value Ref Range   Specific Gravity, UA 1.010 1.005 - 1.030   pH, UA 6.0 5.0 - 7.5   Color, UA Yellow Yellow   Appearance Ur Clear Clear   Leukocytes,UA Negative Negative   Protein,UA Negative Negative/Trace   Glucose, UA Negative Negative   Ketones, UA Negative Negative   RBC, UA Negative Negative   Bilirubin, UA Negative Negative   Urobilinogen, Ur 0.2 0.2 - 1.0 mg/dL   Nitrite, UA Negative Negative    Treatments: steroids: BMX q24 hrs x 2  Hospital Course:  This is a 40 y.o. B8G6659 with IUP at 27w1dseen for elevated BPs.   Vitals:   05/20/21 2323 05/21/21 0322 05/21/21 0614 05/21/21 0736  BP: 139/78 113/74 121/77 133/76   05/21/21 0925 05/21/21 1335 05/21/21 1430 05/21/21 1443  BP: 130/70 (!) 145/75 (!) 162/89 (!) 152/93   05/21/21 1638 05/21/21 1650 05/21/21 1748 05/21/21 1805  BP: (!) 148/79 (!) 151/74 (!) 165/84 (!) 146/81    Labs WNL (seen above)  She was observed, fetal heart rate monitoring remained reassuring, and she had no signs/symptoms of pre-eclampsia or other maternal-fetal concerns.  She was deemed stable for discharge to home with expectation to  follow up back at the hospital for NST and blood pressure check.  Precautions discussed in detail with an interpreter.  Discharge Physical Exam:  BP (!) 146/81   Pulse 92   Temp (!) 97.1 F (36.2 C) (Oral)   Resp 14   Ht 4' 10"  (1.473 m)   Wt 68 kg   LMP 09/30/2020   BMI 31.35 kg/m   General: NAD CV: RRR Pulm: nl effort ABD: s/nd/nt, gravid DVT Evaluation: LE non-ttp, no evidence of DVT on exam.  NST: FHR baseline: 140 bpm Variability: moderate Accelerations: yes Decelerations: none Category/reactivity: reactive  TOCO: quiet SVE: deferred      Discharge Condition: Stable  Disposition: Discharge disposition: 01-Home or Self Care       Allergies as of 05/21/2021   No Known Allergies      Medication List     TAKE these medications    Prenatal Vitamin 27-0.8 MG Tabs Take 1 tablet by mouth daily.        Follow-up Information     [facility](s).   Specialty: Obstetrics and Gynecology Contact information: [phone]               Signed:  JRegina Eck8/27/2022 6:33 PM

## 2021-05-20 NOTE — Progress Notes (Signed)
PRENATAL VISIT NOTE  Subjective:  Amber Stark is a 40 y.o. [phone] at [redacted]w[redacted]d being seen today for ongoing prenatal care.  She is currently monitored for the following issues for this high-risk pregnancy and has Advanced maternal age in multigravida, 40 yo; Nonspecific reaction to tuberculin test; Supervision of high risk pregnancy, antepartum; Urinary tract infection affecting pregnancy, 03/15/21 >100,042mixed flora; Late prenatal care 23 5/7 wks; Abnormal glucose tolerance test in pregnancy; and Obesity affecting pregnancy BMI=32.1 on their problem list.  Patient reports  right arm "pain" for 4 days from above elbow to fingers .  Contractions: Not present. Vag. Bleeding: None.  Movement: Present. Denies leaking of fluid/ROM.   The following portions of the patient's history were reviewed and updated as appropriate: allergies, current medications, past family history, past medical history, past social history, past surgical history and problem list. Problem list updated.  Objective:   Vitals:   05/20/21 0824 05/20/21 0835  BP: (!) 165/93 (!) 161/92  Pulse: 79   Temp: (!) 97.1 F (36.2 C)   Weight: 157 lb 6.4 oz (71.4 kg)     Fetal Status: Fetal Heart Rate (bpm): 133 Fundal Height: 35 cm Movement: Present     General:  Alert, oriented and cooperative. Patient is in no acute distress.  Skin: Skin is warm and dry. No rash noted.   Cardiovascular: Normal heart rate noted  Respiratory: Normal respiratory effort, no problems with respiration noted  Abdomen: Soft, gravid, appropriate for gestational age.  Pain/Pressure: Absent     Pelvic: Cervical exam deferred        Extremities: Normal range of motion.  Edema: Trace  Mental Status: Normal mood and affect. Normal behavior. Normal judgment and thought content.   Assessment and Plan:  Pregnancy: G7P6006 at [redacted]w[redacted]d  1. Advanced maternal age in multigravida, 40 yo 2. Supervision of high risk pregnancy, antepartum -  patient reports numbness in right arm that has been going on for 4 days, with some tingling in her fingers for 1 day.  PT does not work.   -reports stressed and has been having trouble with 76 yr old daughter.   -patient reports only eating chicken soup with 2 sodas at 5 pm and cup of coffee with a a little bread at 8pm.   -discussed with pt about the importance of eating more often and protein with all meals.    3. Elevated blood pressure reading Blood pressures higher than last visit.  B/P's 165/93, 161/91 taken 15 mins later.      -denies s/sx of pre-eclampsia, no  edema currently, no scotoma, upper gastric pain, n/v or HA.   - PIH pending,  - U/A - protein negative, - dip WNL.   -  call to D. Beasley @ [facility] for report, agreed that pt needs to be seen.  Pt to be sent to L& D.  Waiting for patient spouse to RTC to discuss needs to further evaluation at [facility].    Last b/p 178/98-pt is upset that she needs to be evaluated at the hospital.    Notified patient of need for b/p check in 1 week.    - Urinalysis (Urine Dip) - PIH Panel ([facility] [id number])   Preterm labor symptoms and general obstetric precautions including but not limited to vaginal bleeding, contractions, leaking of fluid and fetal movement were reviewed in detail with the patient. Please refer to After Visit Summary for other counseling recommendations.  No follow-ups on file.  Future  Appointments  Date Time Provider Department Center  05/31/2021 11:30 AM [facility]   V. Olmedo used for [place] interpretation.     Wendi Snipes, FNP

## 2021-05-20 NOTE — Progress Notes (Addendum)
Patient here for MH RV at 33 1/7. Patient setting up VM at this time. Patient states her right arm is hurting for the past 4 days, states she feels her arm is "numb or asleep", "my whole arm". Patient would like to discuss what type of BCM she can use while breastfeeding.Marland KitchenMarland KitchenMarland KitchenBurt Knack, RN

## 2021-05-21 ENCOUNTER — Other Ambulatory Visit: Payer: Self-pay | Admitting: Obstetrics and Gynecology

## 2021-05-21 LAB — AST+BUN+CREAT+LD+URIC A+HGB...
AST: 23 IU/L (ref 0–40)
BUN: 6 mg/dL (ref 6–20)
Creatinine, Ser: 0.58 mg/dL (ref 0.57–1.00)
Hematocrit: 38.8 % (ref 34.0–46.6)
Hemoglobin: 12.5 g/dL (ref 11.1–15.9)
LDH: 164 IU/L (ref 119–226)
Platelets: 247 10*3/uL (ref 150–450)
Uric Acid: 4.9 mg/dL (ref 2.6–6.2)
eGFR: 119 mL/min/{1.73_m2} (ref >59)

## 2021-05-21 NOTE — Progress Notes (Signed)
Interpreter Marquita Palms [phone]

## 2021-05-21 NOTE — Discharge Instructions (Signed)
NST scheduled for Monday at 9:00 am.  Call the [facility] Department for prenatal problem visit for blood pressure checks on Wednesday and Friday.

## 2021-05-21 NOTE — Progress Notes (Signed)
Interpreter [phone] [email or web address]

## 2021-05-21 NOTE — Progress Notes (Signed)
[facility] Amber Stark is a 40 y.o. [phone] female at [redacted]w[redacted]d dated by LMP.  She will present to L&D for NST, labs, and U/S d/t gestational hypertension.  Pregnancy Issues: AMA Obesity Abnormal glucose test   Prenatal care site: [facility]   EFW: 05/20/21 2,146g 43%ile   Pertinent Results:  Prenatal Labs: Blood type/Rh O pos  Antibody screen neg  Rubella Immune  Varicella Immune  RPR NR  HBsAg Neg  HIV NR  GC neg  Chlamydia neg  Genetic screening declined  1 hour GTT 174  3 hour GTT 65 - 182 - 141 - 117  GBS     5. Post Partum Planning: - Infant feeding: Both - Contraception: unknown - Tdap  - Flu declined  Haroldine Laws, CNM 05/21/2021 6:42 PM

## 2021-05-23 ENCOUNTER — Observation Stay: Payer: Self-pay

## 2021-05-23 ENCOUNTER — Observation Stay
Admission: EM | Admit: 2021-05-23 | Discharge: 2021-05-23 | Disposition: A | Discharge status: Home / Self Care | Payer: Self-pay | Attending: Obstetrics and Gynecology | Admitting: Obstetrics and Gynecology

## 2021-05-23 ENCOUNTER — Encounter: Payer: Self-pay | Admitting: Obstetrics and Gynecology

## 2021-05-23 DIAGNOSIS — O133 Gestational [pregnancy-induced] hypertension without significant proteinuria, third trimester: Principal | ICD-10-CM | POA: Diagnosis present

## 2021-05-23 DIAGNOSIS — O1490 Unspecified pre-eclampsia, unspecified trimester: Secondary | ICD-10-CM

## 2021-05-23 DIAGNOSIS — O368330 Maternal care for abnormalities of the fetal heart rate or rhythm, third trimester, not applicable or unspecified: Secondary | ICD-10-CM | POA: Insufficient documentation

## 2021-05-23 DIAGNOSIS — O99213 Obesity complicating pregnancy, third trimester: Secondary | ICD-10-CM | POA: Insufficient documentation

## 2021-05-23 DIAGNOSIS — Z3A33 33 weeks gestation of pregnancy: Secondary | ICD-10-CM | POA: Insufficient documentation

## 2021-05-23 DIAGNOSIS — E669 Obesity, unspecified: Secondary | ICD-10-CM | POA: Insufficient documentation

## 2021-05-23 HISTORY — DX: Gestational (pregnancy-induced) hypertension without significant proteinuria, third trimester: O13.3

## 2021-05-23 LAB — CBC WITH DIFFERENTIAL/PLATELET
Abs Immature Granulocytes: 0.57 10*3/uL — ABNORMAL HIGH (ref 0.00–0.07)
Basophils Absolute: 0.1 10*3/uL (ref 0.0–0.1)
Basophils Relative: 1 %
Eosinophils Absolute: 0 10*3/uL (ref 0.0–0.5)
Eosinophils Relative: 0 %
HCT: 34.4 % — ABNORMAL LOW (ref 36.0–46.0)
Hemoglobin: 11.7 g/dL — ABNORMAL LOW (ref 12.0–15.0)
Immature Granulocytes: 5 %
Lymphocytes Relative: 22 %
Lymphs Abs: 2.5 10*3/uL (ref 0.7–4.0)
MCH: 30.5 pg (ref 26.0–34.0)
MCHC: 34 g/dL (ref 30.0–36.0)
MCV: 89.6 fL (ref 80.0–100.0)
Monocytes Absolute: 0.9 10*3/uL (ref 0.1–1.0)
Monocytes Relative: 8 %
Neutro Abs: 7 10*3/uL (ref 1.7–7.7)
Neutrophils Relative %: 64 %
Platelets: 238 10*3/uL (ref 150–400)
RBC: 3.84 MIL/uL — ABNORMAL LOW (ref 3.87–5.11)
RDW: 16.3 % — ABNORMAL HIGH (ref 11.5–15.5)
WBC: 11 10*3/uL — ABNORMAL HIGH (ref 4.0–10.5)
nRBC: 0 % (ref 0.0–0.2)

## 2021-05-23 LAB — COMPREHENSIVE METABOLIC PANEL
ALT: 22 U/L (ref 0–44)
AST: 23 U/L (ref 15–41)
Albumin: 2.9 g/dL — ABNORMAL LOW (ref 3.5–5.0)
Alkaline Phosphatase: 92 U/L (ref 38–126)
Anion gap: 10 (ref 5–15)
BUN: 15 mg/dL (ref 6–20)
CO2: 21 mmol/L — ABNORMAL LOW (ref 22–32)
Calcium: 8.3 mg/dL — ABNORMAL LOW (ref 8.9–10.3)
Chloride: 103 mmol/L (ref 98–111)
Creatinine, Ser: 0.58 mg/dL (ref 0.44–1.00)
GFR, Estimated: >60 mL/min (ref >60)
Glucose, Bld: 77 mg/dL (ref 70–99)
Potassium: 3.6 mmol/L (ref 3.5–5.1)
Sodium: 134 mmol/L — ABNORMAL LOW (ref 135–145)
Total Bilirubin: 0.3 mg/dL (ref 0.3–1.2)
Total Protein: 6.6 g/dL (ref 6.5–8.1)

## 2021-05-23 LAB — TYPE AND SCREEN
ABO/RH(D): O POS
Antibody Screen: NEGATIVE

## 2021-05-23 LAB — PROTEIN / CREATININE RATIO, URINE
Creatinine, Urine: 98 mg/dL
Protein Creatinine Ratio: 0.18 mg/mg{Cre} — ABNORMAL HIGH (ref 0.00–0.15)
Total Protein, Urine: 18 mg/dL

## 2021-05-23 MED ORDER — NIFEDIPINE 10 MG PO CAPS: 10.0000 mg | ORAL_CAPSULE | ORAL | Status: DC | PRN

## 2021-05-23 MED ORDER — HYDRALAZINE HCL 20 MG/ML IJ SOLN: 10.0000 mg | INTRAMUSCULAR | Status: DC | PRN

## 2021-05-23 MED ORDER — LABETALOL HCL 5 MG/ML IV SOLN: 20.0000 mg | Freq: Once | INTRAVENOUS | Status: AC

## 2021-05-23 MED ORDER — LABETALOL HCL 5 MG/ML IV SOLN
40.0000 mg | INTRAVENOUS | Status: DC | PRN
Start: 2021-05-23 — End: 2021-05-23

## 2021-05-23 MED ORDER — NIFEDIPINE 10 MG PO CAPS
ORAL_CAPSULE | ORAL | Status: AC
  Administered 2021-05-23: 10 mg via ORAL
  Filled 2021-05-23: qty 1

## 2021-05-23 MED ORDER — LABETALOL HCL 5 MG/ML IV SOLN: 40.0000 mg | INTRAVENOUS | Status: DC | PRN

## 2021-05-23 MED ORDER — LABETALOL HCL 5 MG/ML IV SOLN
20.0000 mg | INTRAVENOUS | Status: DC | PRN
Start: 2021-05-23 — End: 2021-05-23

## 2021-05-23 MED ORDER — NIFEDIPINE 10 MG PO CAPS: 20.0000 mg | ORAL_CAPSULE | ORAL | Status: DC | PRN

## 2021-05-23 MED ORDER — NIFEDIPINE ER OSMOTIC RELEASE 30 MG PO TB24
30.0000 mg | ORAL_TABLET | Freq: Every day | ORAL | Status: DC
  Administered 2021-05-23: 30 mg via ORAL
  Filled 2021-05-23: qty 1

## 2021-05-23 MED ORDER — NIFEDIPINE ER 30 MG PO TB24: 30.0000 mg | ORAL_TABLET | Freq: Every day | ORAL | 0 refills | Status: DC

## 2021-05-23 MED ORDER — LABETALOL HCL 5 MG/ML IV SOLN
INTRAVENOUS | Status: AC
  Administered 2021-05-23: 20 mg via INTRAVENOUS
  Filled 2021-05-23: qty 4

## 2021-05-23 MED ORDER — LABETALOL HCL 5 MG/ML IV SOLN: 80.0000 mg | INTRAVENOUS | Status: DC | PRN

## 2021-05-23 NOTE — Progress Notes (Signed)
Fetal monitoring removed at this time. Pt transported to Ultrasound.

## 2021-05-23 NOTE — OB Triage Note (Signed)
Pt is a 39yo G7P6 at [redacted]w[redacted]d that presents for scheduled NST and BP check. Pt denies HA, Visual Changes, Epigastric pain. Pt states positive FM, Denies LOF, VB. Pt has no clonus and reflexes plus 2. EFM applied and initial FHT 125. Initial BP 167/88 and repeat was 127/75 and cycling q .

## 2021-05-23 NOTE — Discharge Summary (Addendum)
Patient ID: [facility] Rudi Coco MRN: [id number] DOB/AGE: August 13, 1981 40 y.o.  Admit date: 05/23/2021 Discharge date: 05/23/2021  Admission Diagnoses: scheduled NST due to [facility]  Discharge Diagnoses: Gestational hypertension at 33 weeks  Factors complicating pregnancy: AMA Obesity Abnormal glucose test GHTN  Prenatal Procedures: NST, limited OB US  Consults: None  Significant Diagnostic Studies:  Results for orders placed or performed during the hospital encounter of 05/23/21 (from the past 168 hour(s))  Protein / creatinine ratio, urine   Collection Time: 05/23/21 10:44 AM  Result Value Ref Range   Creatinine, Urine 98 mg/dL   Total Protein, Urine 18 mg/dL   Protein Creatinine Ratio 0.18 (H) 0.00 - 0.15 mg/mg[Cre]  CBC with Differential/Platelet   Collection Time: 05/23/21 10:54 AM  Result Value Ref Range   WBC 11.0 (H) 4.0 - 10.5 K/uL   RBC 3.84 (L) 3.87 - 5.11 MIL/uL   Hemoglobin 11.7 (L) 12.0 - 15.0 g/dL   HCT 34.4 (L) 36.0 - 46.0 %   MCV 89.6 80.0 - 100.0 fL   MCH 30.5 26.0 - 34.0 pg   MCHC 34.0 30.0 - 36.0 g/dL   RDW 16.3 (H) 11.5 - 15.5 %   Platelets 238 150 - 400 K/uL   nRBC 0.0 0.0 - 0.2 %   Neutrophils Relative % 64 %   Neutro Abs 7.0 1.7 - 7.7 K/uL   Lymphocytes Relative 22 %   Lymphs Abs 2.5 0.7 - 4.0 K/uL   Monocytes Relative 8 %   Monocytes Absolute 0.9 0.1 - 1.0 K/uL   Eosinophils Relative 0 %   Eosinophils Absolute 0.0 0.0 - 0.5 K/uL   Basophils Relative 1 %   Basophils Absolute 0.1 0.0 - 0.1 K/uL   Immature Granulocytes 5 %   Abs Immature Granulocytes 0.57 (H) 0.00 - 0.07 K/uL  Comprehensive metabolic panel   Collection Time: 05/23/21 10:54 AM  Result Value Ref Range   Sodium 134 (L) 135 - 145 mmol/L   Potassium 3.6 3.5 - 5.1 mmol/L   Chloride 103 98 - 111 mmol/L   CO2 21 (L) 22 - 32 mmol/L   Glucose, Bld 77 70 - 99 mg/dL   BUN 15 6 - 20 mg/dL   Creatinine, Ser 0.58 0.44 - 1.00 mg/dL   Calcium 8.3 (L) 8.9 - 10.3 mg/dL    Total Protein 6.6 6.5 - 8.1 g/dL   Albumin 2.9 (L) 3.5 - 5.0 g/dL   AST 23 15 - 41 U/L   ALT 22 0 - 44 U/L   Alkaline Phosphatase 92 38 - 126 U/L   Total Bilirubin 0.3 0.3 - 1.2 mg/dL   GFR, Estimated >60 >60 mL/min   Anion gap 10 5 - 15  Type and screen [place]   Collection Time: 05/23/21 11:41 AM  Result Value Ref Range   ABO/RH(D) O POS    Antibody Screen NEG    Sample Expiration      05/26/2021,2359 Performed at [facility], [address] [place], [place] [address]   Results for orders placed or performed during the hospital encounter of 05/20/21 (from the past 168 hour(s))  Protein / creatinine ratio, urine   Collection Time: 05/20/21 11:39 AM  Result Value Ref Range   Creatinine, Urine <10 mg/dL   Total Protein, Urine <6 mg/dL   Protein Creatinine Ratio        0.00 - 0.15 mg/mg[Cre]  Comprehensive metabolic panel   Collection Time: 05/20/21 11:48 AM  Result Value Ref Range   Sodium 136 135 - 145 mmol/L   Potassium 3.4 (L) 3.5 - 5.1 mmol/L   Chloride 106 98 - 111 mmol/L   CO2 22 22 - 32 mmol/L   Glucose, Bld 87 70 - 99 mg/dL   BUN 6 6 - 20 mg/dL   Creatinine, Ser 0.44 0.44 - 1.00 mg/dL   Calcium 8.2 (L) 8.9 - 10.3 mg/dL   Total Protein 6.8 6.5 - 8.1 g/dL   Albumin 3.0 (L) 3.5 - 5.0 g/dL   AST 29 15 - 41 U/L   ALT 21 0 - 44 U/L   Alkaline Phosphatase 121 38 - 126 U/L   Total Bilirubin 0.3 0.3 - 1.2 mg/dL   GFR, Estimated >60 >60 mL/min   Anion gap 8 5 - 15  CBC with Differential   Collection Time: 05/20/21 11:48 AM  Result Value Ref Range   WBC 10.7 (H) 4.0 - 10.5 K/uL   RBC 4.19 3.87 - 5.11 MIL/uL   Hemoglobin 12.4 12.0 - 15.0 g/dL   HCT 37.3 36.0 - 46.0 %   MCV 89.0 80.0 - 100.0 fL   MCH 29.6 26.0 - 34.0 pg   MCHC 33.2 30.0 - 36.0 g/dL   RDW 16.4 (H) 11.5 - 15.5 %   Platelets 235 150 - 400 K/uL   nRBC 0.0 0.0 - 0.2 %   Neutrophils Relative % 79 %   Neutro Abs 8.5 (H) 1.7 - 7.7 K/uL   Lymphocytes Relative 13 %    Lymphs Abs 1.4 0.7 - 4.0 K/uL   Monocytes Relative 5 %   Monocytes Absolute 0.6 0.1 - 1.0 K/uL   Eosinophils Relative 0 %   Eosinophils Absolute 0.0 0.0 - 0.5 K/uL   Basophils Relative 1 %   Basophils Absolute 0.1 0.0 - 0.1 K/uL   Immature Granulocytes 2 %   Abs Immature Granulocytes 0.19 (H) 0.00 - 0.07 K/uL  Results for orders placed or performed in visit on 05/20/21 (from the past 168 hour(s))  PIH Panel ([facility] [id number])   Collection Time: 05/20/21  9:00 AM  Result Value Ref Range   Uric Acid 4.9 2.6 - 6.2 mg/dL   BUN 6 6 - 20 mg/dL   Creatinine, Ser 0.58 0.57 - 1.00 mg/dL   eGFR 119 >59 mL/min/1.73   LDH 164 119 - 226 IU/L   AST 23 0 - 40 IU/L   Hemoglobin 12.5 11.1 - 15.9 g/dL   Hematocrit 38.8 34.0 - 46.6 %   Platelets 247 150 - 450 x10E3/uL  Urinalysis (Urine Dip)   Collection Time: 05/20/21 11:25 AM  Result Value Ref Range   Specific Gravity, UA 1.010 1.005 - 1.030   pH, UA 6.0 5.0 - 7.5   Color, UA Yellow Yellow   Appearance Ur Clear Clear   Leukocytes,UA Negative Negative   Protein,UA Negative Negative/Trace   Glucose, UA Negative Negative   Ketones, UA Negative Negative   RBC, UA Negative Negative   Bilirubin, UA Negative Negative   Urobilinogen, Ur 0.2 0.2 - 1.0 mg/dL   Nitrite, UA Negative Negative    Treatments: NST  Hospital Course:  This is a 40 y.o. W1U9323 with IUP at 61w4dseen for elevated BPs.   Vitals:   05/23/21 1056 05/23/21 1108 05/23/21 1122 05/23/21 1137  BP: (!) 162/79 (!) 171/90 (!) 167/73 (!) 152/74   05/23/21 1152 05/23/21 1207 05/23/21 1222 05/23/21 1237  BP: 136/79 (!) 142/75 137/76 138/74   05/23/21 1252 05/23/21  1351 05/23/21 1405 05/23/21 1420  BP: 140/70 (!) 169/83 (!) 148/71 (!) 154/71    She was observed, fetal heart rate monitoring remained reassuring, and she had no signs/symptoms of pre-eclampsia or other maternal-fetal concerns. Labile blood pressures.  Severe range BP were persistent, requiring PO then IV  antihypertensive medication. Repeat labs were obtained and WNL, proteinuria present now. Limited OB US performed, AFI 11.9cm (improved from 8cm on 8/26).  Discussed with Dr Leafy Ro, recommended inpatient care until delivery at [facility]. Pt reports unable to remain inpatient due to childcare concerns. Pt has cuff at home and is able to check BP twice daily, recommended modified bedrest and daily NST at hospital. DC home with Nifedipine 16m XL daily (dose given today). Instructions given via interpreter.   Discharge Physical Exam:  BP (!) 154/71   Pulse 74   Temp 97.8 F (36.6 C) (Oral)   Resp 16   LMP 09/30/2020   General: NAD CV: RRR Pulm: nl effort ABD: s/nd/nt, gravid DVT Evaluation: LE non-ttp, no evidence of DVT on exam.  NST: FHR baseline: 130 bpm Variability: moderate Accelerations: yes Decelerations: none Category/reactivity: reactive  TOCO: occasional UC noted.  SVE: deferred      Discharge Condition: Stable  Disposition: Discharge disposition: 01-Home or Self Care       Follow-up Information     [facility] Follow up on 05/24/2021.   Specialty: Obstetrics and Gynecology Why: [facility] come [facility] at 9:00am for fetal non-stress test Contact information: [phone]               Signed:  RFrancetta Found CTangipahoa 05/23/2021 3:21 PM

## 2021-05-23 NOTE — Discharge Summary (Signed)
RN and interpreter at the bedside. RN went over the red flag warning signs of preeclampsia and when to return to the hospital to seek medical treatment. RN set up Pt NST for tomorrow and went over new medications. Pt given opportunity for questions. All questions answered at this time. Pt verbalized understanding. Peripheral IV removed. Pt discharged home.

## 2021-05-24 ENCOUNTER — Observation Stay
Admission: RE | Admit: 2021-05-24 | Discharge: 2021-05-24 | Disposition: A | Discharge status: Home / Self Care | Payer: Self-pay

## 2021-05-24 DIAGNOSIS — O133 Gestational [pregnancy-induced] hypertension without significant proteinuria, third trimester: Secondary | ICD-10-CM | POA: Diagnosis present

## 2021-05-24 DIAGNOSIS — Z3A33 33 weeks gestation of pregnancy: Secondary | ICD-10-CM | POA: Insufficient documentation

## 2021-05-24 DIAGNOSIS — Z79899 Other long term (current) drug therapy: Secondary | ICD-10-CM | POA: Insufficient documentation

## 2021-05-24 DIAGNOSIS — O0993 Supervision of high risk pregnancy, unspecified, third trimester: Principal | ICD-10-CM | POA: Insufficient documentation

## 2021-05-24 LAB — RPR: RPR Ser Ql: NONREACTIVE

## 2021-05-24 NOTE — Discharge Summary (Signed)
[facility] [name] is a 40 y.o. female. She is at [redacted]w[redacted]d gestation. Patient's last menstrual period was 09/30/2020. Estimated Date of Delivery: 07/07/21    Prenatal care site: [facility]    Chief Complaint: high risk pregnancy, need for antepartum surveillance   Admission Diagnoses: scheduled NST due to [facility]   Discharge Diagnoses: Gestational hypertension at 33 weeks  S: Resting comfortably. no CTX, no VB.no LOF,  Active fetal movement.    Maternal Medical History:  Past Medical Hx:  has a past medical history of Anxiety, Depression, Gestational hypertension w/o significant proteinuria in 3rd trimester (05/23/2021), and Tuberculosis.  Past Surgical Hx:  has a past surgical history that includes No past surgeries.   No Known Allergies  Prior to Admission medications   Medication Sig Start Date End Date Taking? Authorizing Provider  NIFEdipine (ADALAT CC) 30 MG 24 hr tablet Take 1 tablet (30 mg total) by mouth daily. 05/24/21   McVey, Prudencio Pair, CNM  Prenatal Vit-Fe Fumarate-FA (PRENATAL VITAMIN) 27-0.8 MG TABS Take 1 tablet by mouth daily. 05/06/21   Sciora, Austin Miles, CNM     Social History: She  reports that she has never smoked. She has never used smokeless tobacco. She reports that she does not drink alcohol and does not use drugs.  Family History: family history includes Healthy in her brother, brother, brother, brother, daughter, daughter, daughter, daughter, sister, sister, sister, son, and son; Heart murmur in her mother.  no history of gyn cancers  Review of Systems: A full review of systems was performed and negative except as noted in the HPI.     O:  BP 123/76   Pulse 81   LMP 09/30/2020  Results for orders placed or performed during the hospital encounter of 05/23/21 (from the past 48 hour(s))  Protein / creatinine ratio, urine   Collection Time: 05/23/21 10:44 AM  Result Value Ref Range   Creatinine, Urine 98 mg/dL    Total Protein, Urine 18 mg/dL   Protein Creatinine Ratio 0.18 (H) 0.00 - 0.15 mg/mg[Cre]  CBC with Differential/Platelet   Collection Time: 05/23/21 10:54 AM  Result Value Ref Range   WBC 11.0 (H) 4.0 - 10.5 K/uL   RBC 3.84 (L) 3.87 - 5.11 MIL/uL   Hemoglobin 11.7 (L) 12.0 - 15.0 g/dL   HCT 66.4 (L) 40.3 - 47.4 %   MCV 89.6 80.0 - 100.0 fL   MCH 30.5 26.0 - 34.0 pg   MCHC 34.0 30.0 - 36.0 g/dL   RDW 25.9 (H) 56.3 - 87.5 %   Platelets 238 150 - 400 K/uL   nRBC 0.0 0.0 - 0.2 %   Neutrophils Relative % 64 %   Neutro Abs 7.0 1.7 - 7.7 K/uL   Lymphocytes Relative 22 %   Lymphs Abs 2.5 0.7 - 4.0 K/uL   Monocytes Relative 8 %   Monocytes Absolute 0.9 0.1 - 1.0 K/uL   Eosinophils Relative 0 %   Eosinophils Absolute 0.0 0.0 - 0.5 K/uL   Basophils Relative 1 %   Basophils Absolute 0.1 0.0 - 0.1 K/uL   Immature Granulocytes 5 %   Abs Immature Granulocytes 0.57 (H) 0.00 - 0.07 K/uL  Comprehensive metabolic panel   Collection Time: 05/23/21 10:54 AM  Result Value Ref Range   Sodium 134 (L) 135 - 145 mmol/L   Potassium 3.6 3.5 - 5.1 mmol/L   Chloride 103 98 - 111 mmol/L   CO2 21 (L) 22 - 32 mmol/L  Glucose, Bld 77 70 - 99 mg/dL   BUN 15 6 - 20 mg/dL   Creatinine, Ser 9.62 0.44 - 1.00 mg/dL   Calcium 8.3 (L) 8.9 - 10.3 mg/dL   Total Protein 6.6 6.5 - 8.1 g/dL   Albumin 2.9 (L) 3.5 - 5.0 g/dL   AST 23 15 - 41 U/L   ALT 22 0 - 44 U/L   Alkaline Phosphatase 92 38 - 126 U/L   Total Bilirubin 0.3 0.3 - 1.2 mg/dL   GFR, Estimated >95 >28 mL/min   Anion gap 10 5 - 15  RPR   Collection Time: 05/23/21 11:41 AM  Result Value Ref Range   RPR Ser Ql NON REACTIVE NON REACTIVE  Type and screen [facility]   Collection Time: 05/23/21 11:41 AM  Result Value Ref Range   ABO/RH(D) O POS    Antibody Screen NEG    Sample Expiration      05/26/2021,2359 Performed at [facility], [address] [place], [place] [address]       Vitals:   05/24/21 0950  05/24/21 1002 05/24/21 1012 05/24/21 1027  BP: (!) 143/81 140/78 136/76 107/62   05/24/21 1042  BP: 123/76     Constitutional: NAD, AAOx3  HE/ENT: extraocular movements grossly intact, moist mucous membranes CV: RRR PULM: nl respiratory effort, CTABL     Abd: gravid, non-tender, non-distended, soft      Ext: Non-tender, Nonedmeatous   Psych: mood appropriate, speech normal Pelvic: deferred   NST: Baseline: 130 Variability: moderate Accelerations present x >2 Decelerations absent Time 30 mins   A/P: 40 y.o. [redacted]w[redacted]d with high risk pregnancy and antepartum surveillance.  Fetal Wellbeing: Reassuring Cat 1 tracing. Reactive NST  No severe range blood pressures noted.   Taking procardia daily  Planned IOL at 34 weeks - Scheduled for this Friday.   Unable to stay inpatient d/t concerns at home.  Will return tomorrow for NST and blood pressure check.   D/c home stable, precautions reviewed, follow-up as scheduled.   ----- Margaretmary Eddy, CNM Certified Nurse Midwife [place]  [facility]

## 2021-05-24 NOTE — OB Triage Note (Addendum)
Via Interpretor patient states that she is here today to monitor baby and to have blood pressure checked.  Denies any headache, abdominal paint, vision problems. States she started on medication for her  Blood pressure this morning States she did not eat this morning and has only had water to drink.  Grape juice given.

## 2021-05-25 ENCOUNTER — Other Ambulatory Visit: Payer: Self-pay

## 2021-05-25 ENCOUNTER — Observation Stay
Admission: RE | Admit: 2021-05-25 | Discharge: 2021-05-25 | Disposition: A | Discharge status: Home / Self Care | Payer: Self-pay | Attending: Obstetrics | Admitting: Obstetrics

## 2021-05-25 ENCOUNTER — Encounter: Payer: Self-pay | Admitting: Obstetrics and Gynecology

## 2021-05-25 DIAGNOSIS — O99810 Abnormal glucose complicating pregnancy: Secondary | ICD-10-CM

## 2021-05-25 DIAGNOSIS — O133 Gestational [pregnancy-induced] hypertension without significant proteinuria, third trimester: Secondary | ICD-10-CM | POA: Insufficient documentation

## 2021-05-25 DIAGNOSIS — Z3689 Encounter for other specified antenatal screening: Secondary | ICD-10-CM

## 2021-05-25 DIAGNOSIS — Z3A33 33 weeks gestation of pregnancy: Secondary | ICD-10-CM | POA: Insufficient documentation

## 2021-05-25 DIAGNOSIS — O0930 Supervision of pregnancy with insufficient antenatal care, unspecified trimester: Secondary | ICD-10-CM

## 2021-05-25 DIAGNOSIS — O09513 Supervision of elderly primigravida, third trimester: Principal | ICD-10-CM | POA: Insufficient documentation

## 2021-05-25 NOTE — Discharge Summary (Signed)
[facility] Amber Stark is a 40 y.o. female. She is at [redacted]w[redacted]d gestation. Patient's last menstrual period was 09/30/2020. Estimated Date of Delivery: 07/07/21    Prenatal care site: [facility]   Chief Complaint: high risk pregnancy, need for antepartum surveillance   S: Resting comfortably. no CTX, no VB.no LOF,  Active fetal movement.    Maternal Medical History:  Past Medical Hx:  has a past medical history of Anxiety, Depression, Gestational hypertension w/o significant proteinuria in 3rd trimester (05/23/2021), and Tuberculosis.  Past Surgical Hx:  has a past surgical history that includes No past surgeries.   No Known Allergies  Prior to Admission medications   Medication Sig Start Date End Date Taking? Authorizing Provider  NIFEdipine (ADALAT CC) 30 MG 24 hr tablet Take 1 tablet (30 mg total) by mouth daily. 05/24/21  Yes McVey, Prudencio Pair, CNM  Prenatal Vit-Fe Fumarate-FA (PRENATAL VITAMIN) 27-0.8 MG TABS Take 1 tablet by mouth daily. 05/06/21  Yes Sciora, Austin Miles, CNM     Social History: She  reports that she has never smoked. She has never used smokeless tobacco. She reports that she does not drink alcohol and does not use drugs.  Family History: family history includes Healthy in her brother, brother, brother, brother, daughter, daughter, daughter, daughter, sister, sister, sister, son, and son; Heart murmur in her mother.  no history of gyn cancers  Review of Systems: A full review of systems was performed and negative except as noted in the HPI.     O:  BP (!) 146/77 (BP Location: Left Arm)   Pulse 87   Temp 97.8 F (36.6 C) (Oral)   Resp 16   Ht 4\' 10"  (1.473 m)   Wt 68 kg   LMP 09/30/2020   SpO2 99%   BMI 31.35 kg/m  Results for orders placed or performed during the hospital encounter of 05/23/21 (from the past 48 hour(s))  RPR   Collection Time: 05/23/21 11:41 AM  Result Value Ref Range   RPR Ser Ql NON REACTIVE NON REACTIVE  Type and screen  [facility]   Collection Time: 05/23/21 11:41 AM  Result Value Ref Range   ABO/RH(D) O POS    Antibody Screen NEG    Sample Expiration      05/26/2021,2359 Performed at [facility], [address] [place], [place]       Constitutional: NAD, AAOx3  HE/ENT: extraocular movements grossly intact, moist mucous membranes CV: RRR PULM: nl respiratory effort, CTABL     Abd: gravid, non-tender, non-distended, soft      Ext: Non-tender, Nonedmeatous   Psych: mood appropriate, speech normal Pelvic: deferred   NST: Baseline: 145 Variability: moderate Accelerations present x >2 Decelerations absent Time 95188   A/P: 40 y.o. [redacted]w[redacted]d with high risk pregnancy and antepartum surveillance.  Labor: not present.  Fetal Wellbeing: Reassuring Cat 1 tracing. Reactive NST  D/c home stable, precautions reviewed, follow-up as scheduled.   ----- [redacted]w[redacted]d, CNM Certified Nurse Midwife [place]  [facility]

## 2021-05-25 NOTE — OB Triage Note (Signed)
Pt presents for scheduled NST. Initial BP elevated 146/77. Pt reports taking her medication this morning. Pt denies bleeding or LOF. Reports positive fetal movement. Pt denies HA, blurry vision, or epigastric pain. +2 reflexes. No clonus. Other VSS. Will continue to monitor.

## 2021-05-25 NOTE — Discharge Summary (Signed)
Rn reviewed discharge instructions with patient. Gave patient opportunity for questions. All questions answered at this time. Pt verbalized understanding. Pt discharged home 

## 2021-05-26 ENCOUNTER — Other Ambulatory Visit: Payer: Self-pay | Admitting: Obstetrics and Gynecology

## 2021-05-26 DIAGNOSIS — O09523 Supervision of elderly multigravida, third trimester: Secondary | ICD-10-CM

## 2021-05-26 DIAGNOSIS — O99213 Obesity complicating pregnancy, third trimester: Secondary | ICD-10-CM

## 2021-05-27 ENCOUNTER — Telehealth: Payer: Self-pay

## 2021-05-27 ENCOUNTER — Inpatient Hospital Stay: Payer: Medicaid Other | Admitting: Anesthesiology

## 2021-05-27 ENCOUNTER — Inpatient Hospital Stay
Admission: AD | Admit: 2021-05-27 | Discharge: 2021-05-30 | DRG: 806 | Disposition: A | Discharge status: Home / Self Care | Payer: Medicaid Other

## 2021-05-27 ENCOUNTER — Other Ambulatory Visit: Payer: Self-pay

## 2021-05-27 ENCOUNTER — Ambulatory Visit: Payer: Self-pay

## 2021-05-27 ENCOUNTER — Encounter: Payer: Self-pay | Admitting: Obstetrics and Gynecology

## 2021-05-27 DIAGNOSIS — O99214 Obesity complicating childbirth: Secondary | ICD-10-CM | POA: Diagnosis present

## 2021-05-27 DIAGNOSIS — O1493 Unspecified pre-eclampsia, third trimester: Secondary | ICD-10-CM | POA: Diagnosis present

## 2021-05-27 DIAGNOSIS — Z3A34 34 weeks gestation of pregnancy: Secondary | ICD-10-CM

## 2021-05-27 DIAGNOSIS — O9081 Anemia of the puerperium: Secondary | ICD-10-CM | POA: Diagnosis not present

## 2021-05-27 DIAGNOSIS — Z20822 Contact with and (suspected) exposure to covid-19: Secondary | ICD-10-CM | POA: Diagnosis present

## 2021-05-27 DIAGNOSIS — O139 Gestational [pregnancy-induced] hypertension without significant proteinuria, unspecified trimester: Secondary | ICD-10-CM | POA: Diagnosis present

## 2021-05-27 DIAGNOSIS — D62 Acute posthemorrhagic anemia: Secondary | ICD-10-CM | POA: Diagnosis not present

## 2021-05-27 DIAGNOSIS — O1414 Severe pre-eclampsia complicating childbirth: Principal | ICD-10-CM | POA: Diagnosis present

## 2021-05-27 LAB — PROTEIN / CREATININE RATIO, URINE
Creatinine, Urine: 33 mg/dL
Total Protein, Urine: <6 mg/dL

## 2021-05-27 LAB — COMPREHENSIVE METABOLIC PANEL
ALT: 32 U/L (ref 0–44)
AST: 41 U/L (ref 15–41)
Albumin: 3.1 g/dL — ABNORMAL LOW (ref 3.5–5.0)
Alkaline Phosphatase: 119 U/L (ref 38–126)
Anion gap: 9 (ref 5–15)
BUN: 10 mg/dL (ref 6–20)
CO2: 20 mmol/L — ABNORMAL LOW (ref 22–32)
Calcium: 9 mg/dL (ref 8.9–10.3)
Chloride: 105 mmol/L (ref 98–111)
Creatinine, Ser: 0.54 mg/dL (ref 0.44–1.00)
GFR, Estimated: >60 mL/min (ref >60)
Glucose, Bld: 67 mg/dL — ABNORMAL LOW (ref 70–99)
Potassium: 3.6 mmol/L (ref 3.5–5.1)
Sodium: 134 mmol/L — ABNORMAL LOW (ref 135–145)
Total Bilirubin: 0.6 mg/dL (ref 0.3–1.2)
Total Protein: 6.9 g/dL (ref 6.5–8.1)

## 2021-05-27 LAB — TYPE AND SCREEN
ABO/RH(D): O POS
Antibody Screen: NEGATIVE

## 2021-05-27 LAB — CBC
HCT: 38.4 % (ref 36.0–46.0)
Hemoglobin: 13.4 g/dL (ref 12.0–15.0)
MCH: 30.9 pg (ref 26.0–34.0)
MCHC: 34.9 g/dL (ref 30.0–36.0)
MCV: 88.7 fL (ref 80.0–100.0)
Platelets: 235 10*3/uL (ref 150–400)
RBC: 4.33 MIL/uL (ref 3.87–5.11)
RDW: 16 % — ABNORMAL HIGH (ref 11.5–15.5)
WBC: 10.9 10*3/uL — ABNORMAL HIGH (ref 4.0–10.5)
nRBC: 0 % (ref 0.0–0.2)

## 2021-05-27 LAB — RESP PANEL BY RT-PCR (FLU A&B, COVID) ARPGX2
Influenza A by PCR: NEGATIVE
Influenza B by PCR: NEGATIVE
SARS Coronavirus 2 by RT PCR: NEGATIVE

## 2021-05-27 LAB — GROUP B STREP BY PCR: Group B strep by PCR: NEGATIVE

## 2021-05-27 MED ORDER — MISOPROSTOL 25 MCG QUARTER TABLET
25.0000 ug | ORAL_TABLET | ORAL | Status: DC | PRN
  Administered 2021-05-27: 25 ug via VAGINAL
  Filled 2021-05-27: qty 1

## 2021-05-27 MED ORDER — PRENATAL MULTIVITAMIN CH
1.0000 | ORAL_TABLET | Freq: Every day | ORAL | Status: DC
  Administered 2021-05-28: 1 via ORAL
  Filled 2021-05-27: qty 1

## 2021-05-27 MED ORDER — OXYTOCIN 10 UNIT/ML IJ SOLN
INTRAMUSCULAR | Status: AC
  Filled 2021-05-27: qty 2

## 2021-05-27 MED ORDER — PHENYLEPHRINE 40 MCG/ML (10ML) SYRINGE FOR IV PUSH (FOR BLOOD PRESSURE SUPPORT)
80.0000 ug | PREFILLED_SYRINGE | INTRAVENOUS | Status: DC | PRN
Start: 2021-05-27 — End: 2021-05-27

## 2021-05-27 MED ORDER — LIDOCAINE-EPINEPHRINE (PF) 1.5 %-1:200000 IJ SOLN
INTRAMUSCULAR | Status: DC | PRN
  Administered 2021-05-27: 3 mL via EPIDURAL

## 2021-05-27 MED ORDER — SODIUM CHLORIDE 0.9% FLUSH
3.0000 mL | Freq: Two times a day (BID) | INTRAVENOUS | Status: DC
Start: 2021-05-27 — End: 2021-05-30
  Administered 2021-05-28 – 2021-05-30 (×4): 3 mL via INTRAVENOUS

## 2021-05-27 MED ORDER — LACTATED RINGERS IV SOLN: 500.0000 mL | Freq: Once | INTRAVENOUS | Status: AC

## 2021-05-27 MED ORDER — PRENATAL MULTIVITAMIN CH
1.0000 | ORAL_TABLET | Freq: Every day | ORAL | Status: DC
  Administered 2021-05-29: 1 via ORAL
  Filled 2021-05-27: qty 1

## 2021-05-27 MED ORDER — IBUPROFEN 600 MG PO TABS
600.0000 mg | ORAL_TABLET | Freq: Four times a day (QID) | ORAL | Status: DC
Start: 2021-05-28 — End: 2021-05-30
  Administered 2021-05-27 – 2021-05-30 (×6): 600 mg via ORAL
  Filled 2021-05-27 (×7): qty 1

## 2021-05-27 MED ORDER — AMMONIA AROMATIC IN INHA
RESPIRATORY_TRACT | Status: AC
  Filled 2021-05-27: qty 10

## 2021-05-27 MED ORDER — DIPHENHYDRAMINE HCL 50 MG/ML IJ SOLN: 12.5000 mg | INTRAMUSCULAR | Status: DC | PRN

## 2021-05-27 MED ORDER — PHENYLEPHRINE 40 MCG/ML (10ML) SYRINGE FOR IV PUSH (FOR BLOOD PRESSURE SUPPORT): 80.0000 ug | PREFILLED_SYRINGE | INTRAVENOUS | Status: DC | PRN

## 2021-05-27 MED ORDER — PRENATAL VITAMIN 27-0.8 MG PO TABS
1.0000 | ORAL_TABLET | Freq: Every day | ORAL | Status: DC
Start: 2021-05-27 — End: 2021-05-27

## 2021-05-27 MED ORDER — SODIUM CHLORIDE 0.9% FLUSH: 3.0000 mL | INTRAVENOUS | Status: DC | PRN

## 2021-05-27 MED ORDER — LACTATED RINGERS IV SOLN: INTRAVENOUS | Status: DC

## 2021-05-27 MED ORDER — LABETALOL HCL 5 MG/ML IV SOLN
80.0000 mg | INTRAVENOUS | Status: DC | PRN
  Administered 2021-05-29: 80 mg via INTRAVENOUS
  Filled 2021-05-27: qty 16

## 2021-05-27 MED ORDER — LIDOCAINE HCL (PF) 1 % IJ SOLN: 30.0000 mL | INTRAMUSCULAR | Status: DC | PRN

## 2021-05-27 MED ORDER — SOD CITRATE-CITRIC ACID 500-334 MG/5ML PO SOLN: 30.0000 mL | ORAL | Status: DC | PRN

## 2021-05-27 MED ORDER — SODIUM CHLORIDE 0.9% FLUSH: 3.0000 mL | Freq: Two times a day (BID) | INTRAVENOUS | Status: DC

## 2021-05-27 MED ORDER — DIPHENHYDRAMINE HCL 25 MG PO CAPS: 25.0000 mg | ORAL_CAPSULE | Freq: Four times a day (QID) | ORAL | Status: DC | PRN

## 2021-05-27 MED ORDER — SODIUM CHLORIDE 0.9 % IV SOLN: 250.0000 mL | INTRAVENOUS | Status: DC | PRN

## 2021-05-27 MED ORDER — LACTATED RINGERS IV SOLN
500.0000 mL | INTRAVENOUS | Status: DC | PRN
  Administered 2021-05-27: 300 mL via INTRAVENOUS

## 2021-05-27 MED ORDER — FENTANYL-BUPIVACAINE-NACL 0.5-0.125-0.9 MG/250ML-% EP SOLN
12.0000 mL/h | EPIDURAL | Status: DC | PRN
  Administered 2021-05-27: 12 mL/h via EPIDURAL

## 2021-05-27 MED ORDER — ACETAMINOPHEN 325 MG PO TABS
650.0000 mg | ORAL_TABLET | ORAL | Status: DC | PRN
  Administered 2021-05-27 – 2021-05-29 (×4): 650 mg via ORAL
  Filled 2021-05-27 (×4): qty 2

## 2021-05-27 MED ORDER — FENTANYL-BUPIVACAINE-NACL 0.5-0.125-0.9 MG/250ML-% EP SOLN
EPIDURAL | Status: AC
  Filled 2021-05-27: qty 250

## 2021-05-27 MED ORDER — SENNOSIDES-DOCUSATE SODIUM 8.6-50 MG PO TABS
2.0000 | ORAL_TABLET | ORAL | Status: DC
  Administered 2021-05-28 – 2021-05-29 (×3): 2 via ORAL
  Filled 2021-05-27 (×3): qty 2

## 2021-05-27 MED ORDER — MISOPROSTOL 200 MCG PO TABS
ORAL_TABLET | ORAL | Status: AC
  Administered 2021-05-27: 800 ug
  Filled 2021-05-27: qty 4

## 2021-05-27 MED ORDER — PENICILLIN G POTASSIUM 5000000 UNITS IJ SOLR: 5.0000 10*6.[IU] | Freq: Once | INTRAMUSCULAR | Status: DC

## 2021-05-27 MED ORDER — SODIUM CHLORIDE 0.9% FLUSH
3.0000 mL | INTRAVENOUS | Status: DC | PRN
Start: 2021-05-27 — End: 2021-05-29

## 2021-05-27 MED ORDER — SIMETHICONE 80 MG PO CHEW: 80.0000 mg | CHEWABLE_TABLET | ORAL | Status: DC | PRN

## 2021-05-27 MED ORDER — ONDANSETRON HCL 4 MG/2ML IJ SOLN: 4.0000 mg | INTRAMUSCULAR | Status: DC | PRN

## 2021-05-27 MED ORDER — DIBUCAINE (PERIANAL) 1 % EX OINT: 1.0000 "application " | TOPICAL_OINTMENT | CUTANEOUS | Status: DC | PRN

## 2021-05-27 MED ORDER — WITCH HAZEL-GLYCERIN EX PADS: 1.0000 "application " | MEDICATED_PAD | CUTANEOUS | Status: DC | PRN

## 2021-05-27 MED ORDER — OXYTOCIN BOLUS FROM INFUSION
333.0000 mL | Freq: Once | INTRAVENOUS | Status: AC
Start: 2021-05-27 — End: 2021-05-27
  Administered 2021-05-27: 333 mL via INTRAVENOUS

## 2021-05-27 MED ORDER — ONDANSETRON HCL 4 MG/2ML IJ SOLN: 4.0000 mg | Freq: Four times a day (QID) | INTRAMUSCULAR | Status: DC | PRN

## 2021-05-27 MED ORDER — ACETAMINOPHEN 325 MG PO TABS: 650.0000 mg | ORAL_TABLET | ORAL | Status: DC | PRN

## 2021-05-27 MED ORDER — LIDOCAINE HCL (PF) 1 % IJ SOLN
INTRAMUSCULAR | Status: DC | PRN
  Administered 2021-05-27: 3 mL via SUBCUTANEOUS

## 2021-05-27 MED ORDER — PENICILLIN G POT IN DEXTROSE 60000 UNIT/ML IV SOLN: 3.0000 10*6.[IU] | INTRAVENOUS | Status: DC

## 2021-05-27 MED ORDER — COCONUT OIL OIL: 1.0000 "application " | TOPICAL_OIL | Status: DC | PRN

## 2021-05-27 MED ORDER — MAGNESIUM SULFATE BOLUS VIA INFUSION
4.0000 g | Freq: Once | INTRAVENOUS | Status: AC
  Administered 2021-05-27: 4 g via INTRAVENOUS
  Filled 2021-05-27: qty 1000

## 2021-05-27 MED ORDER — BENZOCAINE-MENTHOL 20-0.5 % EX AERO
1.0000 "application " | INHALATION_SPRAY | CUTANEOUS | Status: DC | PRN
  Filled 2021-05-27: qty 56

## 2021-05-27 MED ORDER — LIDOCAINE HCL (PF) 1 % IJ SOLN
INTRAMUSCULAR | Status: AC
  Filled 2021-05-27: qty 30

## 2021-05-27 MED ORDER — MAGNESIUM SULFATE 40 GM/1000ML IV SOLN
2.0000 g/h | INTRAVENOUS | Status: DC
  Administered 2021-05-27 – 2021-05-28 (×3): 2 g/h via INTRAVENOUS
  Filled 2021-05-27 (×2): qty 1000

## 2021-05-27 MED ORDER — EPHEDRINE 5 MG/ML INJ: 10.0000 mg | INTRAVENOUS | Status: DC | PRN

## 2021-05-27 MED ORDER — IBUPROFEN 600 MG PO TABS
ORAL_TABLET | ORAL | Status: AC
  Filled 2021-05-27: qty 1

## 2021-05-27 MED ORDER — ONDANSETRON HCL 4 MG PO TABS
4.0000 mg | ORAL_TABLET | ORAL | Status: DC | PRN
  Filled 2021-05-27: qty 1

## 2021-05-27 MED ORDER — FENTANYL CITRATE (PF) 100 MCG/2ML IJ SOLN: 50.0000 ug | INTRAMUSCULAR | Status: DC | PRN

## 2021-05-27 MED ORDER — BUPIVACAINE HCL (PF) 0.25 % IJ SOLN
INTRAMUSCULAR | Status: DC | PRN
  Administered 2021-05-27 (×2): 3 mL via EPIDURAL

## 2021-05-27 MED ORDER — LABETALOL HCL 5 MG/ML IV SOLN
20.0000 mg | INTRAVENOUS | Status: DC | PRN
  Administered 2021-05-27 – 2021-05-29 (×3): 20 mg via INTRAVENOUS
  Filled 2021-05-27 (×3): qty 4

## 2021-05-27 MED ORDER — NIFEDIPINE ER OSMOTIC RELEASE 30 MG PO TB24: 30.0000 mg | ORAL_TABLET | Freq: Every day | ORAL | Status: DC

## 2021-05-27 MED ORDER — TETANUS-DIPHTH-ACELL PERTUSSIS 5-2.5-18.5 LF-MCG/0.5 IM SUSY: 0.5000 mL | PREFILLED_SYRINGE | Freq: Once | INTRAMUSCULAR | Status: DC

## 2021-05-27 MED ORDER — LABETALOL HCL 5 MG/ML IV SOLN
40.0000 mg | INTRAVENOUS | Status: DC | PRN
  Administered 2021-05-27 – 2021-05-29 (×2): 40 mg via INTRAVENOUS
  Filled 2021-05-27 (×2): qty 8

## 2021-05-27 MED ORDER — OXYTOCIN-SODIUM CHLORIDE 30-0.9 UT/500ML-% IV SOLN
1.0000 m[IU]/min | INTRAVENOUS | Status: DC
  Administered 2021-05-27: 2 m[IU]/min via INTRAVENOUS
  Filled 2021-05-27: qty 500

## 2021-05-27 MED ORDER — HYDRALAZINE HCL 20 MG/ML IJ SOLN
10.0000 mg | INTRAMUSCULAR | Status: DC | PRN
  Administered 2021-05-29: 10 mg via INTRAVENOUS
  Filled 2021-05-27: qty 1

## 2021-05-27 MED ORDER — TERBUTALINE SULFATE 1 MG/ML IJ SOLN
0.2500 mg | Freq: Once | INTRAMUSCULAR | Status: DC | PRN
Start: 2021-05-27 — End: 2021-05-27

## 2021-05-27 MED ORDER — OXYTOCIN-SODIUM CHLORIDE 30-0.9 UT/500ML-% IV SOLN
2.5000 [IU]/h | INTRAVENOUS | Status: AC
  Administered 2021-05-27: 2.5 [IU]/h via INTRAVENOUS

## 2021-05-27 MED ORDER — MISOPROSTOL 25 MCG QUARTER TABLET
25.0000 ug | ORAL_TABLET | ORAL | Status: DC | PRN
  Administered 2021-05-27: 25 ug via BUCCAL
  Filled 2021-05-27: qty 1

## 2021-05-27 NOTE — Consult Note (Addendum)
Asked by Bonnell Public, CNM,to provide prenatal consultation for 40 y.o. G7 P6 mother who is now 34.[redacted] weeks EGA and is being induced due to pre-eclampsia. Reportedly was treated with betamethasone earlier this week.  I talked with patient and FOB with the remote video interpreter, explaining the need for special care for a preterm infant at [redacted] weeks gestation, including possible needs for DR resuscitation, respiratory support, IV access, and temperature support (incubator). Projected possible length of stay in SCN until 37 - [redacted] wks EGA.  Discussed advantages of feeding with mother's milk and possible use of donor milk as "bridge" if needed until her supply is sufficient.  Patient and FOB were attentive, had no questions, and expressed appreciation for my input. None of their previous 6 children were premature.  Thank you for consulting Neonatology.  Total time 20 minutes, face-to-face time 10 minutes  JWimmer, MD

## 2021-05-27 NOTE — H&P (Signed)
OB History & Physical   History of Present Illness:  Chief Complaint: IOL  HPI:  Amber Stark is a 40 y.o. [phone] female at [redacted]w[redacted]d dated by LMP.  She presents to L&D for IOL d/t pre-eclampsia with severe features.  Active FM  Pregnancy Issues: 1. AMA Obesity Abnormal glucose test GHTN   Maternal Medical History:   Past Medical History:  Diagnosis Date   Anxiety    Depression    Gestational hypertension w/o significant proteinuria in 3rd trimester 05/23/2021   Tuberculosis     Past Surgical History:  Procedure Laterality Date   NO PAST SURGERIES      No Known Allergies  Prior to Admission medications   Medication Sig Start Date End Date Taking? Authorizing Provider  NIFEdipine (ADALAT CC) 30 MG 24 hr tablet Take 1 tablet (30 mg total) by mouth daily. 05/24/21  Yes Amber Stark, Amber Stark, CNM  Prenatal Vit-Fe Fumarate-FA (PRENATAL VITAMIN) 27-0.8 MG TABS Take 1 tablet by mouth daily. 05/06/21  Yes Amber Stark, Amber Stark, CNM     Prenatal care site: [facility]   Social History: She  reports that she has never smoked. She has never used smokeless tobacco. She reports that she does not drink alcohol and does not use drugs.  Family History: family history includes Healthy in her brother, brother, brother, brother, daughter, daughter, daughter, daughter, sister, sister, sister, son, and son; Heart murmur in her mother.   Review of Systems: A full review of systems was performed and negative except as noted in the HPI.     Physical Exam:  Vital Signs: BP (!) 146/77 (BP Location: Left Arm)   Pulse 82   Temp 98.4 F (36.9 C) (Oral)   Resp 16   Ht 4\' 10"  (1.473 m)   Wt 68.6 kg   LMP 09/30/2020   BMI 31.61 kg/m  General: no acute distress.  HEENT: normocephalic, atraumatic Heart: regular rate & rhythm.  No murmurs/rubs/gallops Lungs: clear to auscultation bilaterally, normal respiratory effort Abdomen: soft, gravid, non-tender;  EFW:  2.1kg Pelvic:   External: Normal external female genitalia  Cervix: 1/thick/high   Extremities: non-tender, symmetric, mild edema bilaterally.  DTRs: negative  Neurologic: Alert & oriented x 3.    Results for orders placed or performed during the hospital encounter of 05/27/21 (from the past 24 hour(s))  Resp Panel by RT-PCR (Flu A&B, Covid) Nasopharyngeal Swab     Status: None   Collection Time: 05/27/21 10:30 AM   Specimen: Nasopharyngeal Swab; Nasopharyngeal(NP) swabs in vial transport medium  Result Value Ref Range   SARS Coronavirus 2 by RT PCR NEGATIVE NEGATIVE   Influenza A by PCR NEGATIVE NEGATIVE   Influenza B by PCR NEGATIVE NEGATIVE    Pertinent Results:  Prenatal Labs: Blood type/Rh O+  Antibody screen neg  Rubella Immune  Varicella Immune  RPR NR  HBsAg Neg  HIV NR  GC neg  Chlamydia neg  Genetic screening negative  1 hour GTT 174  3 hour GTT 65, 182, 141, 117  GBS Unknown, rapid PCR collected   FHT: 135, moderate variability, accels TOCO: occasional contractions SVE:  1/thick/high   Cephalic by 07/27/21  Assessment:  [place] Stark is a 40 y.o. [phone] female at [redacted]w[redacted]d with IOL for pre-eclampsia with severe features.   Plan:  1. Admit to Labor & Delivery; consents reviewed and obtained  2. Fetal Well being  - Fetal Tracing: Category I - Group B Streptococcus ppx  indicated: pending - Presentation: vertex confirmed by [place]   3. Routine OB: - Prenatal labs reviewed, as above - Rh + - CBC, T&S, RPR on admit - Clear fluids, IVF  4. Induction of Labor -  Contractions occasional, external toco in place -  Pelvis proven to 3374g -  Plan for induction with vaginal and buccal cytotec followed by Amber Stark catheter and pitocin - Magnesium sulfate for pre-eclampsia upon active labor -  Plan for continuous fetal monitoring  -  Maternal pain control as desired upon request - Anticipate vaginal delivery  5. Post Partum Planning: - Infant  feeding: breast and bottle - Contraception: TBD  Amber Stark CNM Amber Stark, CNM 05/27/21 11:45 AM

## 2021-05-27 NOTE — Progress Notes (Signed)
Labor Progress Note  Apollonia Amini Gable is a 40 y.o. [id number] at [redacted]w[redacted]d by LMP admitted for induction of labor due to Pre-eclamptic toxemia of pregnancy..  Subjective: she is resting comfortably in bed with an epidural, not feeling rectal pressure  Objective: BP 115/68 (BP Location: Left Arm)   Pulse 66   Temp 98.8 F (37.1 C)   Resp 18   Ht 4\' 10"  (1.473 m)   Wt 68.6 kg   LMP 09/30/2020   SpO2 99%   BMI 31.61 kg/m  Notable VS details: reviewed  Fetal Assessment: FHT:  FHR: 130 bpm, variability: moderate,  accelerations:  Present,  decelerations:  Present intermittent variable decels Category/reactivity:  Category II UC:   regular, every 4-5 minutes SVE:    Dilation: 8cm  Effacement: 80  Station:  -1  Consistency: soft  Position: middle  Amniotic color: clear  Labs: Lab Results  Component Value Date   WBC 10.9 (H) 05/27/2021   HGB 13.4 05/27/2021   HCT 38.4 05/27/2021   MCV 88.7 05/27/2021   PLT 235 05/27/2021    Assessment / Plan: Induction of labor due to preeclampsia,  progressing well on pitocin  Labor: Progressing normally Preeclampsia:  on magnesium sulfate Fetal Wellbeing:  Category II Pain Control:  Epidural Anticipated MOD:  NSVD  07/27/2021 CNM Donato Schultz Lavaeh Bau, CNM 05/27/2021, 5:46 PM

## 2021-05-27 NOTE — Anesthesia Preprocedure Evaluation (Signed)
Anesthesia Evaluation  Patient identified by MRN, date of birth, ID band Patient awake    Reviewed: Allergy & Precautions, H&P , NPO status , Patient's Chart, lab work & pertinent test results, reviewed documented beta blocker date and time   Airway Mallampati: II  TM Distance: >3 FB Neck ROM: full    Dental no notable dental hx. (+) Teeth Intact   Pulmonary neg pulmonary ROS, Current Smoker,    Pulmonary exam normal breath sounds clear to auscultation       Cardiovascular Exercise Tolerance: Good hypertension, On Medications negative cardio ROS   Rhythm:regular Rate:Normal     Neuro/Psych negative neurological ROS  negative psych ROS   GI/Hepatic negative GI ROS, Neg liver ROS,   Endo/Other  negative endocrine ROSdiabetes, Gestational  Renal/GU      Musculoskeletal   Abdominal   Peds  Hematology negative hematology ROS (+)   Anesthesia Other Findings   Reproductive/Obstetrics (+) Pregnancy                             Anesthesia Physical Anesthesia Plan  ASA: 2  Anesthesia Plan: Epidural   Post-op Pain Management:    Induction:   PONV Risk Score and Plan:   Airway Management Planned:   Additional Equipment:   Intra-op Plan:   Post-operative Plan:   Informed Consent: I have reviewed the patients History and Physical, chart, labs and discussed the procedure including the risks, benefits and alternatives for the proposed anesthesia with the patient or authorized representative who has indicated his/her understanding and acceptance.       Plan Discussed with:   Anesthesia Plan Comments:         Anesthesia Quick Evaluation

## 2021-05-27 NOTE — Discharge Summary (Addendum)
Obstetrical Discharge Summary  Patient Name: Amber Stark DOB: 1981-04-12 MRN: [id number]  Date of Admission: 05/27/2021 Date of Delivery: 05/27/2021 Delivered by: Donato Schultz, CNM Date of Discharge: 05/30/2021  Primary OB: [facility]  FVC:BSWHQPR'F last menstrual period was 09/30/2020. EDC Estimated Date of Delivery: 07/07/21 Gestational Age at Delivery: [redacted]w[redacted]d   Antepartum complications:  AMA Obesity Abnormal glucose test GHTN with severe range blood pressures  Latent TB - treated with Rifampin  Late prenatal care   Admitting Diagnosis: induction of labor  Secondary Diagnosis: Gestational hypertension w/o significant proteinuria in 3rd trimester Patient Active Problem List   Diagnosis Date Noted   Pre-eclampsia during pregnancy in third trimester, antepartum 05/27/2021   Gestational hypertension 05/27/2021   Gestational hypertension w/o significant proteinuria in 3rd trimester 05/24/2021   Obesity affecting pregnancy BMI=32.1 05/06/2021   Abnormal glucose tolerance test in pregnancy 04/20/2021   Urinary tract infection affecting pregnancy, 03/15/21 >100,079mixed flora 03/22/2021   Late prenatal care 23 5/7 wks 03/22/2021   Supervision of high risk pregnancy, antepartum 03/17/2021   Nonspecific reaction to tuberculin test 06/28/2018   Advanced maternal age in multigravida, 40 yo     Augmentation: AROM, Pitocin, Cytotec, and [place] catheter Complications: preeclampsia and preterm Intrapartum complications/course: 34-week induction of labor for preeclampsia, NSVD Date of Delivery: 05/27/2021 Delivered By: Donato Schultz CNM Delivery Type: spontaneous vaginal delivery Anesthesia: epidural Placenta: spontaneous Laceration: 1st degree Episiotomy: none Newborn Data: Live born female  Birth Weight:  4lbs 0.5oz, 1830g APGAR: 8, 9  Newborn Delivery   Birth date/time: 05/27/2021 18:43:00 Delivery type: Vaginal, Spontaneous      Postpartum Procedures:  None  Edinburgh:  Edinburgh Postnatal Depression Scale Screening Tool 05/29/2021 05/28/2021  I have been able to laugh and see the funny side of things. 0 (No Data)  I have looked forward with enjoyment to things. 1 -  I have blamed myself unnecessarily when things went wrong. 0 -  I have been anxious or worried for no good reason. 2 -  I have felt scared or panicky for no good reason. 1 -  Things have been getting on top of me. 1 -  I have been so unhappy that I have had difficulty sleeping. 1 -  I have felt sad or miserable. 1 -  I have been so unhappy that I have been crying. 1 -  The thought of harming myself has occurred to me. 1 -  Edinburgh Postnatal Depression Scale Total 9 -      Post partum course:  Patient had a  postpartum course complicated by hypertension.  She had severe range blood pressures that required treatment with IV labetalol (20-40-80) and IV hydralazine.  Procardia was increased from 30->60->90mg  and HCTZ was added as a secondary regimen.  By time of discharge on PPD#3, her pain was controlled on oral pain medications; she had appropriate lochia and was ambulating, voiding without difficulty and tolerating regular diet.  Blood pressures were controlled with Procardia 90mg  XL and HCTZ 25mg  daily.  She was deemed stable for discharge to home.     Discharge Physical Exam:  BP 140/86 (BP Location: Left Arm)   Pulse 90   Temp 98.1 F (36.7 C) (Oral)   Resp 16   Ht 4\' 10"  (1.473 m)   Wt 68.6 kg   LMP 09/30/2020   SpO2 100%   Breastfeeding Unknown   BMI 31.61 kg/m   Vitals:   05/29/21 1257 05/29/21 1320 05/29/21 1355 05/29/21 1422  BP: 07/29/21)  168/86 (!) 171/83 (!) 183/86 (!) 177/93   05/29/21 1438 05/29/21 1508 05/29/21 1536 05/29/21 1636  BP: (!) 175/83 (!) 165/91 (!) 148/75 131/73   05/29/21 1924 05/29/21 2337 05/30/21 0329 05/30/21 0820  BP: 134/75 (!) 143/81 133/85 140/86     General: NAD CV: RRR Pulm: CTABL, nl effort ABD: s/nd/nt, fundus firm and below  the umbilicus Lochia: moderate Perineum: minimal edema/well approximated DVT Evaluation: LE non-ttp, no evidence of DVT on exam.  Hemoglobin  Date Value Ref Range Status  05/30/2021 13.4 12.0 - 15.0 g/dL Final  70/62/3762 83.1 11.1 - 15.9 g/dL Final  51/76/1607 37.1  Final   HCT  Date Value Ref Range Status  05/30/2021 40.5 36.0 - 46.0 % Final  12/28/2017 34  Final   Hematocrit  Date Value Ref Range Status  05/20/2021 38.8 34.0 - 46.6 % Final     Disposition: stable, discharge to home. Baby Feeding: breastmilk and formula Baby Disposition: NICU   Rh Immune globulin given: Pos Rubella vaccine given: Immune  Varicella vaccine given: Immune Tdap vaccine given in AP or PP setting: given 04/19/2021 Flu vaccine given in AP or PP setting: declined   Contraception: POP's  Prenatal Labs:  Blood type/Rh O+  Antibody screen neg  Rubella Immune  Varicella Immune  RPR NR  HBsAg Neg  HIV NR  GC neg  Chlamydia neg  Genetic screening negative  1 hour GTT 174  3 hour GTT 65, 182, 141, 117  GBS Negative      Plan:  Amber Stark was discharged to home in good condition. Follow-up appointment in 2-3 days for blood pressure check and with prenatal provider in 6 weeks.   Discharge Medications: Allergies as of 05/30/2021   No Known Allergies      Medication List     TAKE these medications    hydrochlorothiazide 25 MG tablet Commonly known as: HYDRODIURIL Take 1 tablet (25 mg total) by mouth daily.   ibuprofen 600 MG tablet Commonly known as: ADVIL Take 1 tablet (600 mg total) by mouth every 6 (six) hours as needed for mild pain or cramping.   NIFEdipine 90 MG 24 hr tablet Commonly known as: PROCARDIA XL/NIFEDICAL-XL Take 1 tablet (90 mg total) by mouth daily. What changed:  medication strength how much to take   norethindrone 0.35 MG tablet Commonly known as: Ortho Micronor Take 1 tablet (0.35 mg total) by mouth daily. Start 4 weeks  postpartum (when baby is 39 month old)   Prenatal Vitamin 27-0.8 MG Tabs Take 1 tablet by mouth daily.         Follow-up Information     [facility]. Schedule an appointment as soon as possible for a visit in 3 day(s).   Why: for blood pressure check.  Maybe Tueday through Thursday. Contact information: [address] [employer] [place] [address] [phone]        [facility]. Schedule an appointment as soon as possible for a visit in 2 week(s).   Why: postpartum mood check. Contact information: [address] [place] [address] [phone]                Signed:  Al Corpus 05/30/2021  10:29 AM  Margaretmary Eddy, CNM Certified Nurse Midwife [place]  [facility]

## 2021-05-27 NOTE — Telephone Encounter (Signed)
[facility] as scheduled in [facility] 05/27/2021. Call to client and left message to call and reschedule appt as soon as possible. Message also left in Spanish with [facility] [employer] ID # [id number]. Per [employer], client is currently admitted at [facility], no provider / RN notes yet in Epic. Jossie Ng, RN

## 2021-05-27 NOTE — Progress Notes (Signed)
Labor Progress Note  Amber Stark is a 40 y.o. [id number] at [redacted]w[redacted]d by LMP admitted for induction of labor due to Pre-eclamptic toxemia of pregnancy..  Subjective: denies headache, changes of vision, RUQ pain  Objective: BP (!) 165/89   Pulse 63   Temp 98.4 F (36.9 C) (Oral)   Resp 16   Ht 4\' 10"  (1.473 m)   Wt 68.6 kg   LMP 09/30/2020   BMI 31.61 kg/m  Notable VS details: reviewed. BP of 165/89 treated with 1 dose of Labetalol, down to 141/76.  Fetal Assessment: FHT:  FHR: 135 bpm, variability: moderate,  accelerations:  Present,  decelerations:  Absent Category/reactivity:  Category I UC:   regular, every 2-4 minutes Membrane status: intact  Labs: Lab Results  Component Value Date   WBC 10.9 (H) 05/27/2021   HGB 13.4 05/27/2021   HCT 38.4 05/27/2021   MCV 88.7 05/27/2021   PLT 235 05/27/2021   Component     Latest Ref Rng & Units 05/27/2021  Sodium     135 - 145 mmol/L 134 (L)  Potassium     3.5 - 5.1 mmol/L 3.6  Chloride     98 - 111 mmol/L 105  CO2     22 - 32 mmol/L 20 (L)  Glucose     70 - 99 mg/dL 67 (L)  BUN     6 - 20 mg/dL 10  Creatinine     07/27/2021 - 1.00 mg/dL 6.46  Calcium     8.9 - 10.3 mg/dL 9.0  Total Protein     6.5 - 8.1 g/dL 6.9  Albumin     3.5 - 5.0 g/dL 3.1 (L)  AST     15 - 41 U/L 41  ALT     0 - 44 U/L 32  Alkaline Phosphatase     38 - 126 U/L 119  Total Bilirubin     0.3 - 1.2 mg/dL 0.6  GFR, Estimated     >60 mL/min >60  Anion gap     5 - 15 9  SARS Coronavirus 2 by RT PCR     NEGATIVE NEGATIVE  Influenza A By PCR     NEGATIVE NEGATIVE  Influenza B By PCR     NEGATIVE NEGATIVE  Creatinine, Urine     mg/dL 33  Total Protein, Urine     mg/dL <6  Protein Creatinine Ratio     0.00 - 0.15 mg/mgCre   Group B strep by PCR     NEGATIVE NEGATIVE     Assessment / Plan: Induction of labor d/t preeclampsia. Contracting well on cytotec.  Labor: Progressing normally Preeclampsia:  labs stable and  received one dose of labetalol  with bp of 165/89 down to 141/71 Fetal Wellbeing:  Category I Pain Control:  Labor support without medications I/D:  n/a Anticipated MOD:  NSVD  8.03 CNM Donato Schultz Augustina Braddock, CNM 05/27/2021, 1:56 PM

## 2021-05-27 NOTE — Progress Notes (Signed)
Pt back in room from [place]. Monitors applied and assessing.

## 2021-05-27 NOTE — Anesthesia Procedure Notes (Signed)
Epidural Patient location during procedure: OB Start time: 05/27/2021 4:30 PM End time: 05/27/2021 4:45 PM  Staffing Anesthesiologist: Yevette Edwards, MD Resident/CRNA: Karoline Caldwell, CRNA Performed: resident/CRNA   Preanesthetic Checklist Completed: patient identified, IV checked, site marked, risks and benefits discussed, surgical consent, monitors and equipment checked, pre-op evaluation and timeout performed  Epidural Patient position: sitting Prep: ChloraPrep Patient monitoring: heart rate, continuous pulse ox and blood pressure Approach: midline Location: L3-L4 Injection technique: LOR air  Needle:  Needle type: Tuohy  Needle gauge: 17 G Needle length: 9 cm and 9 Needle insertion depth: 5 cm Catheter type: closed end flexible Catheter size: 19 Gauge Catheter at skin depth: 10 cm Test dose: negative and 1.5% lidocaine with Epi 1:200 K  Assessment Events: blood not aspirated, injection not painful, no injection resistance, no paresthesia and negative IV test  Additional Notes 1 attempt Pt. Evaluated and documentation done after procedure finished. Patient identified. Risks/Benefits/Options discussed with patient including but not limited to bleeding, infection, nerve damage, paralysis, failed block, incomplete pain control, headache, blood pressure changes, nausea, vomiting, reactions to medication both or allergic, itching and postpartum back pain. Confirmed with bedside nurse the patient's most recent platelet count. Confirmed with patient that they are not currently taking any anticoagulation, have any bleeding history or any family history of bleeding disorders. Patient expressed understanding and wished to proceed. All questions were answered. Sterile technique was used throughout the entire procedure. Please see nursing notes for vital signs. Test dose was given through epidural catheter and negative prior to continuing to dose epidural or start infusion. Warning signs of  high block given to the patient including shortness of breath, tingling/numbness in hands, complete motor block, or any concerning symptoms with instructions to call for help. Patient was given instructions on fall risk and not to get out of bed. All questions and concerns addressed with instructions to call with any issues or inadequate analgesia.    Patient tolerated the insertion well without immediate complications.Reason for block:procedure for pain

## 2021-05-27 NOTE — Progress Notes (Signed)
Labor Progress Note  Amber Stark is a 40 y.o. [id number] at [redacted]w[redacted]d by LMP admitted for induction of labor due to Pre-eclamptic toxemia of pregnancy..  Subjective: she reports contractions as 2/10, wincing with contractions, handling them well.  Objective: BP 136/79 (BP Location: Left Arm)   Pulse 83   Temp 98.6 F (37 C) (Oral)   Resp 16   Ht 4\' 10"  (1.473 m)   Wt 68.6 kg   LMP 09/30/2020   BMI 31.61 kg/m  Notable VS details: reviewed  Fetal Assessment: FHT:  FHR: 135 bpm, variability: moderate,  accelerations:  Present,  decelerations:  Absent Category/reactivity:  Category I UC:   regular, every 2-5 minutes SVE:    Dilation: 3cm  Effacement: 60  Station:  -2  Consistency: soft  Position: posterior  Membrane status: intact  Labs: Lab Results  Component Value Date   WBC 10.9 (H) 05/27/2021   HGB 13.4 05/27/2021   HCT 38.4 05/27/2021   MCV 88.7 05/27/2021   PLT 235 05/27/2021    Assessment / Plan: Induction of labor d/t preeclampsia, progressing well with Cytotec x1  Labor: Progressing normally on cytotec, [place] catheter placed with ease Preeclampsia:  labs stable Fetal Wellbeing:  Category I Pain Control:  Labor support without medications Anticipated MOD:  NSVD  07/27/2021 CNM Donato Schultz Rosaly Labarbera, CNM 05/27/2021, 3:13 PM

## 2021-05-28 DIAGNOSIS — Z3A34 34 weeks gestation of pregnancy: Secondary | ICD-10-CM | POA: Diagnosis not present

## 2021-05-28 DIAGNOSIS — Z20822 Contact with and (suspected) exposure to covid-19: Secondary | ICD-10-CM | POA: Diagnosis present

## 2021-05-28 DIAGNOSIS — O99214 Obesity complicating childbirth: Secondary | ICD-10-CM | POA: Diagnosis present

## 2021-05-28 DIAGNOSIS — O9081 Anemia of the puerperium: Secondary | ICD-10-CM | POA: Diagnosis not present

## 2021-05-28 DIAGNOSIS — D62 Acute posthemorrhagic anemia: Secondary | ICD-10-CM | POA: Diagnosis not present

## 2021-05-28 DIAGNOSIS — O1414 Severe pre-eclampsia complicating childbirth: Secondary | ICD-10-CM | POA: Diagnosis present

## 2021-05-28 LAB — RPR: RPR Ser Ql: NONREACTIVE

## 2021-05-28 LAB — CBC
HCT: 35.8 % — ABNORMAL LOW (ref 36.0–46.0)
Hemoglobin: 12.1 g/dL (ref 12.0–15.0)
MCH: 29.5 pg (ref 26.0–34.0)
MCHC: 33.8 g/dL (ref 30.0–36.0)
MCV: 87.3 fL (ref 80.0–100.0)
Platelets: 238 10*3/uL (ref 150–400)
RBC: 4.1 MIL/uL (ref 3.87–5.11)
RDW: 16.4 % — ABNORMAL HIGH (ref 11.5–15.5)
WBC: 10.8 10*3/uL — ABNORMAL HIGH (ref 4.0–10.5)
nRBC: 0 % (ref 0.0–0.2)

## 2021-05-28 LAB — COMPREHENSIVE METABOLIC PANEL
ALT: 29 U/L (ref 0–44)
AST: 35 U/L (ref 15–41)
Albumin: 2.8 g/dL — ABNORMAL LOW (ref 3.5–5.0)
Alkaline Phosphatase: 113 U/L (ref 38–126)
Anion gap: 11 (ref 5–15)
BUN: 7 mg/dL (ref 6–20)
CO2: 21 mmol/L — ABNORMAL LOW (ref 22–32)
Calcium: 6.8 mg/dL — ABNORMAL LOW (ref 8.9–10.3)
Chloride: 103 mmol/L (ref 98–111)
Creatinine, Ser: 0.53 mg/dL (ref 0.44–1.00)
GFR, Estimated: >60 mL/min (ref >60)
Glucose, Bld: 91 mg/dL (ref 70–99)
Potassium: 3.7 mmol/L (ref 3.5–5.1)
Sodium: 135 mmol/L (ref 135–145)
Total Bilirubin: 0.5 mg/dL (ref 0.3–1.2)
Total Protein: 6.4 g/dL — ABNORMAL LOW (ref 6.5–8.1)

## 2021-05-28 MED ORDER — NIFEDIPINE ER OSMOTIC RELEASE 30 MG PO TB24
30.0000 mg | ORAL_TABLET | Freq: Every day | ORAL | Status: DC
  Administered 2021-05-28 – 2021-05-29 (×2): 30 mg via ORAL
  Filled 2021-05-28 (×2): qty 1

## 2021-05-28 MED ORDER — CALCIUM GLUCONATE 10 % IV SOLN
INTRAVENOUS | Status: AC
  Filled 2021-05-28: qty 10

## 2021-05-28 MED ORDER — AMMONIA AROMATIC IN INHA
RESPIRATORY_TRACT | Status: AC
  Filled 2021-05-28: qty 10

## 2021-05-28 NOTE — Anesthesia Postprocedure Evaluation (Signed)
Anesthesia Post Note  Patient: Amber Stark [place] Gonzalez-Escobar  Procedure(s) Performed: AN AD HOC LABOR EPIDURAL  Patient location during evaluation: L&D Anesthesia Type: Epidural Level of consciousness: awake and alert Pain management: pain level controlled Vital Signs Assessment: post-procedure vital signs reviewed and stable Respiratory status: spontaneous breathing, nonlabored ventilation and respiratory function stable Cardiovascular status: stable Postop Assessment: no headache, no backache and epidural receding Anesthetic complications: no   No notable events documented.   Last Vitals:  Vitals:   05/28/21 0930 05/28/21 0945  BP:  133/75  Pulse:  98  Resp: 20   Temp:    SpO2:  98%    Last Pain:  Vitals:   05/28/21 0930  TempSrc:   PainSc: 0-No pain                 [name]

## 2021-05-28 NOTE — Progress Notes (Signed)
Post Partum Day 1 Subjective: Doing well, no complaints.  Tolerating regular diet, pain with PO meds, voiding and ambulating without difficulty.  No CP SOB Fever,Chills, N/V or leg pain; denies nipple or breast pain, no HA change of vision, RUQ/epigastric pain  Objective: BP 137/77   Pulse 72   Temp 98.1 F (36.7 C) (Oral)   Resp 18   Ht 4\' 10"  (1.473 m)   Wt 68.6 kg   LMP 09/30/2020   SpO2 98%   Breastfeeding Unknown   BMI 31.61 kg/m   Vitals:   05/28/21 0044 05/28/21 0118 05/28/21 0207 05/28/21 0244  BP: (!) 102/55 113/64 131/84 98/63   05/28/21 0258 05/28/21 0420 05/28/21 0449 05/28/21 0528  BP: 116/70 139/66 140/70 137/90   05/28/21 0544 05/28/21 0644 05/28/21 0745 05/28/21 0845  BP: 106/69 109/68 122/74 137/77      Physical Exam:  General: NAD Breasts: soft/nontender- breast pump at bedside.  CV: RRR Pulm: nl effort, CTABL Abdomen: soft, NT, BS x 4 Perineum: minimal edema, laceration repair well approximated Lochia: moderate Uterine Fundus: fundus firm and 2 fb below umbilicus DVT Evaluation: no cords, ttp LEs   Recent Labs    05/27/21 1246 05/28/21 0657  HGB 13.4 12.1  HCT 38.4 35.8*  WBC 10.9* 10.8*  PLT 235 238   Component     Latest Ref Rng & Units 05/28/2021  Sodium     135 - 145 mmol/L 135  Potassium     3.5 - 5.1 mmol/L 3.7  Chloride     98 - 111 mmol/L 103  CO2     22 - 32 mmol/L 21 (L)  Glucose     70 - 99 mg/dL 91  BUN     6 - 20 mg/dL 7  Creatinine     07/28/2021 - 1.00 mg/dL 1.69  Calcium     8.9 - 10.3 mg/dL 6.8 (L)  Total Protein     6.5 - 8.1 g/dL 6.4 (L)  Albumin     3.5 - 5.0 g/dL 2.8 (L)  AST     15 - 41 U/L 35  ALT     0 - 44 U/L 29  Alkaline Phosphatase     38 - 126 U/L 113  Total Bilirubin     0.3 - 1.2 mg/dL 0.5  GFR, Estimated     >60 mL/min >60  Anion gap     5 - 15 11     Intake/Output Summary (Last 24 hours) at 05/28/2021 07/28/2021 Last data filed at 05/28/2021 0830 Gross per 24 hour  Intake 3417.04 ml  Output  3145 ml  Net 272.04 ml     Assessment/Plan: 40 y.o. 20 postpartum day # 1 Severe Pre-Eclampsia  - Continue routine PP care - Lactation consult prn - Pre-E on Mag at 2gm/hr; good output, BP remain stable, no severe range since delivery.  - Discussed contraceptive options including implant, IUDs hormonal and non-hormonal, injection, pills/ring/patch, condoms, and NFP.  - Acute blood loss anemia - hemodynamically stable and asymptomatic; start po ferrous sulfate BID with stool softeners  - Immunization status: Needs tdap prior to DC    Disposition: remain on Mag until 24hrs PP, then transfer to [facility] for routine care.     [facility], CNM 05/28/2021  9:21 AM

## 2021-05-29 ENCOUNTER — Encounter: Payer: Self-pay | Admitting: Obstetrics and Gynecology

## 2021-05-29 LAB — COMPREHENSIVE METABOLIC PANEL
ALT: 54 U/L — ABNORMAL HIGH (ref 0–44)
AST: 98 U/L — ABNORMAL HIGH (ref 15–41)
Albumin: 3 g/dL — ABNORMAL LOW (ref 3.5–5.0)
Alkaline Phosphatase: 114 U/L (ref 38–126)
Anion gap: 8 (ref 5–15)
BUN: 9 mg/dL (ref 6–20)
CO2: 23 mmol/L (ref 22–32)
Calcium: 8.6 mg/dL — ABNORMAL LOW (ref 8.9–10.3)
Chloride: 103 mmol/L (ref 98–111)
Creatinine, Ser: 0.53 mg/dL (ref 0.44–1.00)
GFR, Estimated: >60 mL/min (ref >60)
Glucose, Bld: 130 mg/dL — ABNORMAL HIGH (ref 70–99)
Potassium: 3.9 mmol/L (ref 3.5–5.1)
Sodium: 134 mmol/L — ABNORMAL LOW (ref 135–145)
Total Bilirubin: 0.7 mg/dL (ref 0.3–1.2)
Total Protein: 7.3 g/dL (ref 6.5–8.1)

## 2021-05-29 LAB — CBC
HCT: 36.2 % (ref 36.0–46.0)
Hemoglobin: 12.3 g/dL (ref 12.0–15.0)
MCH: 30.6 pg (ref 26.0–34.0)
MCHC: 34 g/dL (ref 30.0–36.0)
MCV: 90 fL (ref 80.0–100.0)
Platelets: 238 10*3/uL (ref 150–400)
RBC: 4.02 MIL/uL (ref 3.87–5.11)
RDW: 16.6 % — ABNORMAL HIGH (ref 11.5–15.5)
WBC: 9.2 10*3/uL (ref 4.0–10.5)
nRBC: 0 % (ref 0.0–0.2)

## 2021-05-29 MED ORDER — IBUPROFEN 600 MG PO TABS: 600.0000 mg | ORAL_TABLET | Freq: Four times a day (QID) | ORAL | 0 refills | Status: AC | PRN

## 2021-05-29 MED ORDER — NIFEDIPINE ER OSMOTIC RELEASE 30 MG PO TB24
30.0000 mg | ORAL_TABLET | Freq: Once | ORAL | Status: AC
  Administered 2021-05-29: 30 mg via ORAL
  Filled 2021-05-29: qty 1

## 2021-05-29 MED ORDER — NIFEDIPINE ER OSMOTIC RELEASE 30 MG PO TB24: 60.0000 mg | ORAL_TABLET | Freq: Every day | ORAL | Status: DC

## 2021-05-29 MED ORDER — NORETHINDRONE 0.35 MG PO TABS: 1.0000 | ORAL_TABLET | Freq: Every day | ORAL | 11 refills | Status: AC

## 2021-05-29 MED ORDER — HYDROCHLOROTHIAZIDE 25 MG PO TABS
25.0000 mg | ORAL_TABLET | Freq: Every day | ORAL | Status: DC
  Administered 2021-05-29 – 2021-05-30 (×2): 25 mg via ORAL
  Filled 2021-05-29 (×2): qty 1

## 2021-05-29 NOTE — TOC Initial Note (Signed)
Transition of Care [facility]) - Initial/Assessment Note    Patient Details  Name: Amber Stark MRN: [id number] Date of Birth: 21-Jul-1981  Transition of [facility]) CM/SW Contact:    Marina Goodell Phone Number: [phone] 05/29/2021, 12:39 PM  Clinical Narrative:                  CSW spoke with patient and patient's significant other. CSW explained the role of TOC in patient care. Patient stated she has a large support system and will be staying home baby for the foreseeable future.  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 Patient stated she has all the baby items she needs. Patient did stated she was feeling well.  CSW spoke with patient about [facility] and gave patient an application (Spanish). CSW went over signs of depression and anxiety and encouraged patient to contact her provider for a referral to a therapist or psychiatrist if she was experiencing symptoms.  Patient verbalized understanding.  CSW gave patient resource list and went over different resources on the list.        Patient Goals and CMS Choice        Expected Discharge Plan and Services           Expected Discharge Date: 05/29/21                                    Prior Living Arrangements/Services                       Activities of Daily Living Home Assistive Devices/Equipment: None ADL Screening (condition at time of admission) Patient's cognitive ability adequate to safely complete daily activities?: Yes Is the patient deaf or have difficulty hearing?: No Does the patient have difficulty seeing, even when wearing glasses/contacts?: No Does the patient have difficulty concentrating, remembering, or making decisions?: No Patient able to express need for assistance with ADLs?: Yes Does the patient have difficulty dressing or bathing?: No Independently performs ADLs?: Yes (appropriate for  developmental age) Does the patient have difficulty walking or climbing stairs?: No Weakness of Legs: None Weakness of Arms/Hands: None  Permission Sought/Granted                  Emotional Assessment              Admission diagnosis:  Pre-eclampsia during pregnancy in third trimester, antepartum [O14.93] Gestational hypertension [O13.9] Patient Active Problem List   Diagnosis Date Noted   Pre-eclampsia during pregnancy in third trimester, antepartum 05/27/2021   Gestational hypertension 05/27/2021   Gestational hypertension w/o significant proteinuria in 3rd trimester 05/24/2021   Obesity affecting pregnancy BMI=32.1 05/06/2021   Abnormal glucose tolerance test in pregnancy 04/20/2021   Urinary tract infection affecting pregnancy, 03/15/21 >100,017mixed flora 03/22/2021   Late prenatal care 23 5/7 wks 03/22/2021   Supervision of high risk pregnancy, antepartum 03/17/2021   Nonspecific reaction to tuberculin test 06/28/2018   Advanced maternal age in multigravida, 40 yo    PCP:  Pcp, No Pharmacy:   [id number] [place], [place] - [address] AT [facility] [address] [place] [address] Phone: [phone] Fax: [phone]     Social Determinants of Health (SDOH) Interventions    Readmission Risk Interventions No  flowsheet data found.

## 2021-05-29 NOTE — Progress Notes (Signed)
Postpartum Day  2  Subjective:  No fever/chills, chest pain, shortness of breath, nausea/vomiting, or leg pain. No nipple or breast pain. No headache, visual changes, or RUQ/epigastric pain.  Objective: BP (!) 171/83 (BP Location: Left Arm)   Pulse 69   Temp 98.6 F (37 C) (Oral)   Resp 16   Ht 4\' 10"  (1.473 m)   Wt 68.6 kg   LMP 09/30/2020   SpO2 100%   Breastfeeding Unknown   BMI 31.61 kg/m    Vitals:   05/28/21 1449 05/28/21 1550 05/28/21 1916 05/28/21 2042  BP: 129/85 109/79 117/71 (!) 143/85   05/29/21 0018 05/29/21 0405 05/29/21 0818 05/29/21 1003  BP: 118/81 129/81 (!) 149/92 (!) 163/81   05/29/21 1019 05/29/21 1042 05/29/21 1257 05/29/21 1320  BP: (!) 164/82 (!) 152/82 (!) 168/86 (!) 171/83    Physical Exam:  General: alert, cooperative, and no distress  Recent Labs    05/27/21 1246 05/28/21 0657  HGB 13.4 12.1  HCT 38.4 35.8*  WBC 10.9* 10.8*  PLT 235 238    Assessment/Plan: 40 y.o. 20 postpartum day # 2  -Severe range blood pressures noted after morning rounds.   -1 dose of Labetalol 20mg  IV given, procardia increased to 60mg .  -B/P's initially improved after treatment but 2nd severe range blood pressure noted 3 hours after initial treatment  -Repeat 171/80 - Labetalol 20mg  IV given  -HCTC 25 mg ordered for daily regimen   -Discharge cancelled - patient and family understand the need to for continued close surveillance of blood pressures and management    Disposition: Continue inpatient postpartum care   LOS: 2 days   [id number], CNM 05/29/2021, 1:33 PM   -----  Certified Nurse Midwife [facility]

## 2021-05-29 NOTE — Progress Notes (Signed)
Postpartum Day  2  Interpreter used for all communication   Subjective: no complaints, up ad lib, voiding, and tolerating PO  Doing well, no concerns. Ambulating without difficulty, pain managed with PO meds, tolerating regular diet, and voiding without difficulty.   No fever/chills, chest pain, shortness of breath, nausea/vomiting, or leg pain. No nipple or breast pain. No headache, visual changes, or RUQ/epigastric pain.  Objective: BP (!) 149/92 (BP Location: Left Arm) Comment: notify Mallory, RN of bp  Pulse 72   Temp 98 F (36.7 C) (Oral)   Resp 18   Ht 4\' 10"  (1.473 m)   Wt 68.6 kg   LMP 09/30/2020   SpO2 100%   Breastfeeding Unknown   BMI 31.61 kg/m    Vitals:   05/28/21 0745 05/28/21 0845 05/28/21 0945 05/28/21 1044  BP: 122/74 137/77 133/75 94/65   05/28/21 1344 05/28/21 1449 05/28/21 1550 05/28/21 1916  BP: (!) 147/82 129/85 109/79 117/71   05/28/21 2042 05/29/21 0018 05/29/21 0405 05/29/21 0818  BP: (!) 143/85 118/81 129/81 (!) 149/92    Physical Exam:  General: alert, cooperative, and no distress Breasts: soft/nontender CV: RRR Pulm: nl effort, CTABL Abdomen: soft, non-tender, active bowel sounds Uterine Fundus: firm Perineum: minimal edema, intact Lochia: appropriate DVT Evaluation: No evidence of DVT seen on physical exam.  Recent Labs    05/27/21 1246 05/28/21 0657  HGB 13.4 12.1  HCT 38.4 35.8*  WBC 10.9* 10.8*  PLT 235 238    Assessment/Plan: 40 y.o. 20 postpartum day # 2  -Continue routine postpartum care -Lactation consult PRN for breastfeeding  -Currently pumping for infant in NICU - will need pump and supplies for home  -Discussed contraceptive options including implant, IUDs hormonal and non-hormonal, injection, pills/ring/patch, condoms, and NFP. Desires POP's -CBC reviewed - hemodynamically stable and asymptomatic  -Immunization status:   all immunizations up to date -Blood pressures controlled on Procardia, intermittent  mild range blood pressures with the majority WNL -TOC team consult prior to discharge to discuss resources and warning signs for postpartum depression   Disposition: Discussed staying inpatient for further monitoring.  Desires discharge home today.    LOS: 2 days   [id number], Gustavo Lah 05/29/2021, 8:41 AM   ----- 07/29/2021  Certified Nurse Midwife [facility]

## 2021-05-30 LAB — COMPREHENSIVE METABOLIC PANEL
ALT: 81 U/L — ABNORMAL HIGH (ref 0–44)
AST: 137 U/L — ABNORMAL HIGH (ref 15–41)
Albumin: 3.4 g/dL — ABNORMAL LOW (ref 3.5–5.0)
Alkaline Phosphatase: 132 U/L — ABNORMAL HIGH (ref 38–126)
Anion gap: 10 (ref 5–15)
BUN: 12 mg/dL (ref 6–20)
CO2: 22 mmol/L (ref 22–32)
Calcium: 9 mg/dL (ref 8.9–10.3)
Chloride: 103 mmol/L (ref 98–111)
Creatinine, Ser: 0.73 mg/dL (ref 0.44–1.00)
GFR, Estimated: >60 mL/min (ref >60)
Glucose, Bld: 95 mg/dL (ref 70–99)
Potassium: 3.7 mmol/L (ref 3.5–5.1)
Sodium: 135 mmol/L (ref 135–145)
Total Bilirubin: 0.6 mg/dL (ref 0.3–1.2)
Total Protein: 7.7 g/dL (ref 6.5–8.1)

## 2021-05-30 LAB — CBC
HCT: 40.5 % (ref 36.0–46.0)
Hemoglobin: 13.4 g/dL (ref 12.0–15.0)
MCH: 29.3 pg (ref 26.0–34.0)
MCHC: 33.1 g/dL (ref 30.0–36.0)
MCV: 88.6 fL (ref 80.0–100.0)
Platelets: 272 10*3/uL (ref 150–400)
RBC: 4.57 MIL/uL (ref 3.87–5.11)
RDW: 16.8 % — ABNORMAL HIGH (ref 11.5–15.5)
WBC: 9.6 10*3/uL (ref 4.0–10.5)
nRBC: 0 % (ref 0.0–0.2)

## 2021-05-30 MED ORDER — NIFEDIPINE ER OSMOTIC RELEASE 90 MG PO TB24: 90.0000 mg | ORAL_TABLET | Freq: Every day | ORAL | 3 refills | Status: AC

## 2021-05-30 MED ORDER — HYDROCHLOROTHIAZIDE 25 MG PO TABS: 25.0000 mg | ORAL_TABLET | Freq: Every day | ORAL | 3 refills | Status: AC

## 2021-05-30 MED ORDER — NIFEDIPINE ER OSMOTIC RELEASE 30 MG PO TB24
90.0000 mg | ORAL_TABLET | Freq: Every day | ORAL | Status: DC
  Administered 2021-05-30: 90 mg via ORAL
  Filled 2021-05-30: qty 3

## 2021-05-30 NOTE — Progress Notes (Signed)
Discharge instructions, prescriptions, education, and appointments given and explained. Pt verbalized understanding with no further questions. Pt wheeled to personal vehicle via staff 

## 2021-05-30 NOTE — Lactation Note (Signed)
This note was copied from a baby's chart. Lactation Consultation Note  Patient Name: Amber Stark [id number] Date: 05/30/2021 Reason for consult: Initial assessment;Late-preterm 34-36.6wks;Infant < 6lbs;NICU baby Age:39 hours  Initial lactation visit. Interpreter ID [id number]. P7 mom with baby born SVD 63hrs ago at [redacted]w[redacted]d. Baby is SCN at this time. Mom some breastfeeding experience but cites low milk supply with all previous children.  Mom has been set-up with a pump in her room, and pumping every 4 hours at this time. She has her own electric breast pump at home (can't recall the name of brand at this time).  LC reviewed with mom the importance of pumping frequently, recommended every 3 hours. Encouraged hand expression pre/post pumping for maximum output. Explained milk storage and transport to hospital. LC also let's mom know that a pump will be available at bedside for her to use in SCN after skin to skin with baby, and lactation support with baby going to the breast when she is ready. Mom voiced no questions at this time.  Maternal Data Does the patient have breastfeeding experience prior to this delivery?: Yes How long did the patient breastfeed?: 3 months with most others  Feeding Mother's Current Feeding Choice: Breast Milk and Formula  LATCH Score                    Lactation Tools Discussed/Used Tools: Pump Breast pump type: Double-Electric Breast Pump Reason for Pumping: SCN Pumping frequency: q 4hrs  Interventions Interventions: Hand express;DEBP  Discharge Discharge Education: Outpatient recommendation Pump: Personal (couldn't recall the name) WIC Program: No  Consult Status Consult Status: PRN    Danford Bad 05/30/2021, 10:28 AM

## 2021-05-30 NOTE — Progress Notes (Signed)
Postpartum Day  3 Interpreter used for all communication   Subjective: no complaints, up ad lib, voiding, tolerating PO, and + flatus  Doing well, no concerns. Ambulating without difficulty, pain managed with PO meds, tolerating regular diet, and voiding without difficulty.   No fever/chills, chest pain, shortness of breath, nausea/vomiting, or leg pain. No nipple or breast pain. No headache, visual changes, or RUQ/epigastric pain.  Objective: BP 140/86 (BP Location: Left Arm)   Pulse 90   Temp 98.1 F (36.7 C) (Oral)   Resp 16   Ht 4\' 10"  (1.473 m)   Wt 68.6 kg   LMP 09/30/2020   SpO2 100%   Breastfeeding Unknown   BMI 31.61 kg/m    Vitals:   05/29/21 1257 05/29/21 1320 05/29/21 1355 05/29/21 1422  BP: (!) 168/86 (!) 171/83 (!) 183/86 (!) 177/93   05/29/21 1438 05/29/21 1508 05/29/21 1536 05/29/21 1636  BP: (!) 175/83 (!) 165/91 (!) 148/75 131/73   05/29/21 1924 05/29/21 2337 05/30/21 0329 05/30/21 0820  BP: 134/75 (!) 143/81 133/85 140/86    Physical Exam:  General: alert, cooperative, and no distress Breasts: soft/nontender CV: RRR Pulm: nl effort, CTABL Abdomen: soft, non-tender, active bowel sounds Uterine Fundus: firm Incision:  Perineum: minimal edema, laceration hemostatic Lochia: appropriate DVT Evaluation: No evidence of DVT seen on physical exam.  Recent Labs    05/29/21 1454 05/30/21 0438  HGB 12.3 13.4  HCT 36.2 40.5  WBC 9.2 9.6  PLT 238 272    Assessment/Plan: 40 y.o. 20 postpartum day # 3  -Continue routine postpartum care -Lactation consult PRN for breastfeeding  -Will need pump prior to discharge  -Immunization status:   all immunizations up to date -Several severe range blood pressures yesterday  -Received IV labetalol 20->40->80mg  then IV hydralazine.  -Procardia increased 30->60->90mg  -Hydralazine 25mg  PO added to daily regimen  -Discussed staying inpatient versus discharge today.  Risks of preeclampsia and hypertension  reviewed.  Kindell desires discharge home today.  She has 6 kids at home and she wants to get home to them.     Disposition: Desires discharge home today.  Will plan b/p check in clinic in 2-3 days.    LOS: 3 days   , Byrd Hesselbach 05/30/2021, 10:16 AM   ----- Ina Homes  Certified Nurse Midwife [facility]

## 2021-05-31 ENCOUNTER — Ambulatory Visit: Payer: Self-pay

## 2021-05-31 NOTE — Telephone Encounter (Signed)
Client delivered 05/27/2021. Call to [facility] [place] scheduler, and left message regarding delivery and 05/31/2021 [facility] [place] appt no longer needed. Jossie Ng, RN

## 2021-06-01 LAB — SURGICAL PATHOLOGY

## 2021-06-08 ENCOUNTER — Ambulatory Visit: Payer: Self-pay

## 2021-06-08 NOTE — Lactation Note (Signed)
This note was copied from a baby's chart. Lactation Consultation Note  Patient Name: Amber Stark [id number] Date: 06/08/2021   Age:39 days Lactation met with mom yesterday at bedside. Mom concerned with low milk supply, 64m output of both breasts every 3 hours.  LC discussed pump, flange, and how it feels. Mom stated painful.  Today mom brought in her pump. She has a second hand Spectra, using medela parts/pieces that fit. When pump is first turned on, it is on expression mode and at level 11 suction. When asked, mom states that she has not changed any of the buttons since pumping began.  LChoptankeducated mom on the purpose of each of the buttons: -Mode change from stimulation to expression -Level of mode -Level of suction LC educated mom on the changing of each button to meet mom's comfort.  Encouraged mom and demonstrated how to start the pump on the lowest level in stimulation mode and how to slowly increase suction level until just past uncomfortable and then decreasing suction slightly.  Discussed with mom how this is beneficial for milk expression for it to be comfortable and not painful, for the body to be able to relax, and allow milk to be released.  Mom continues to pump routinely and was encouraged to continue pumping every 3 hours minimally.  Baby may begin starting to do more at the breast, she took 2 oral feedings via bottle over last night and this morning. Mom plans skin to skin and lick and learn today which can continue to be beneficial to her milk supply.  [name]9/14/2022, 11:11 AM

## 2022-03-19 IMAGING — US US OB LIMITED
1 series · 14 of 27 positions shown · non-contrast
Comparison: none

CLINICAL DATA: Preeclampsia.  Follow-up amniotic fluid volume.

EXAM:
LIMITED OBSTETRIC ULTRASOUND

[Series 1: us ob limited · 0.22mm/px · 14 of 27 slices shown]
[im 1/27]
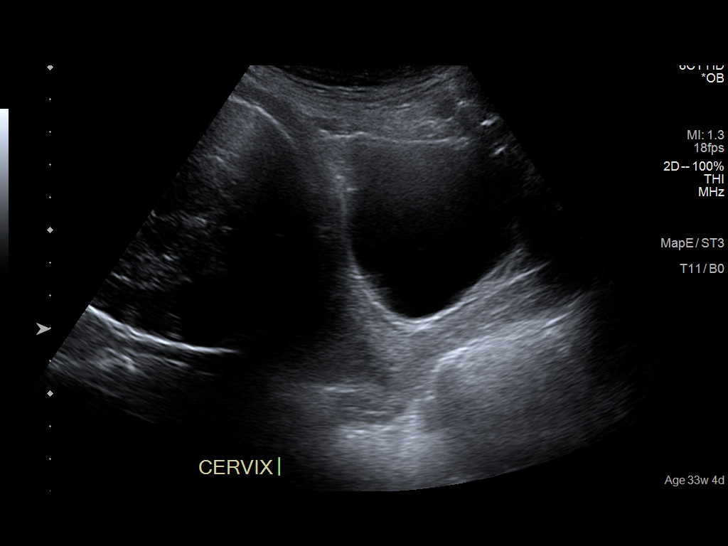
[im 3/27]
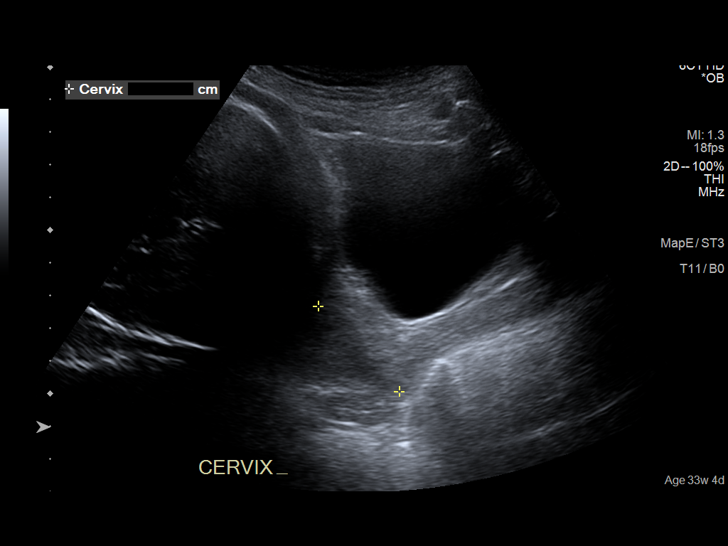
[im 5/27]
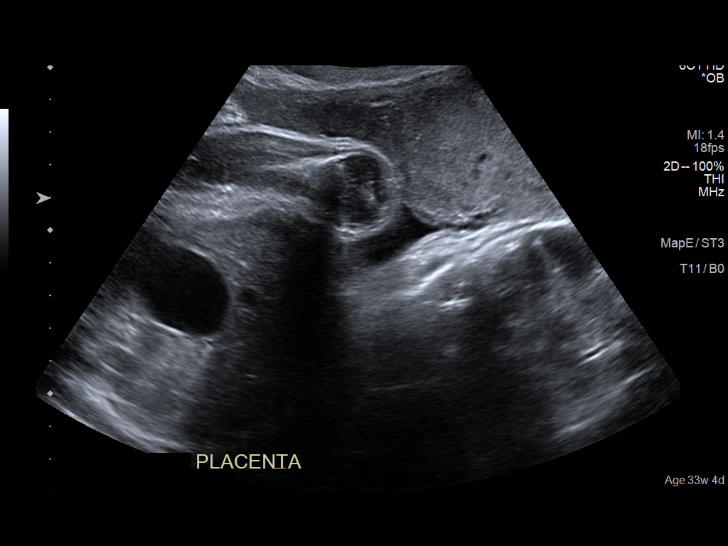
[im 7/27]
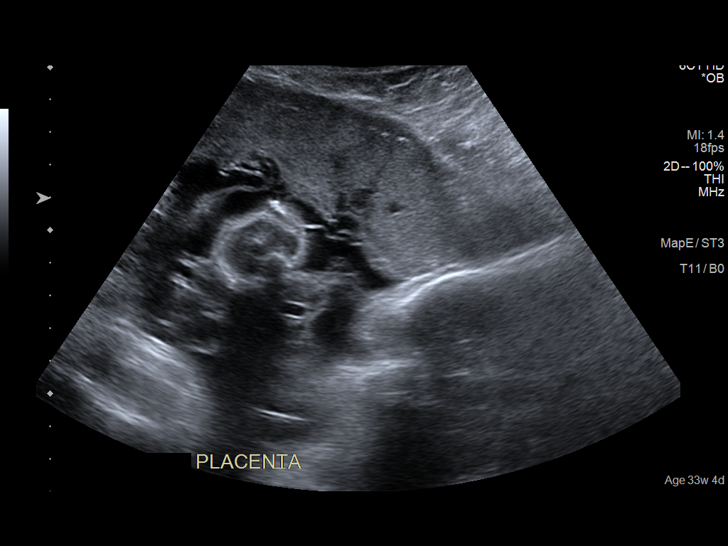
[im 9/27]
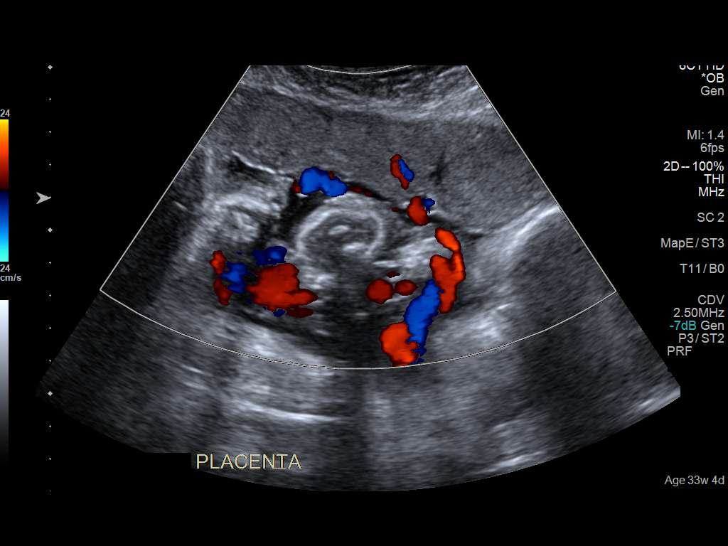
[im 11/27]
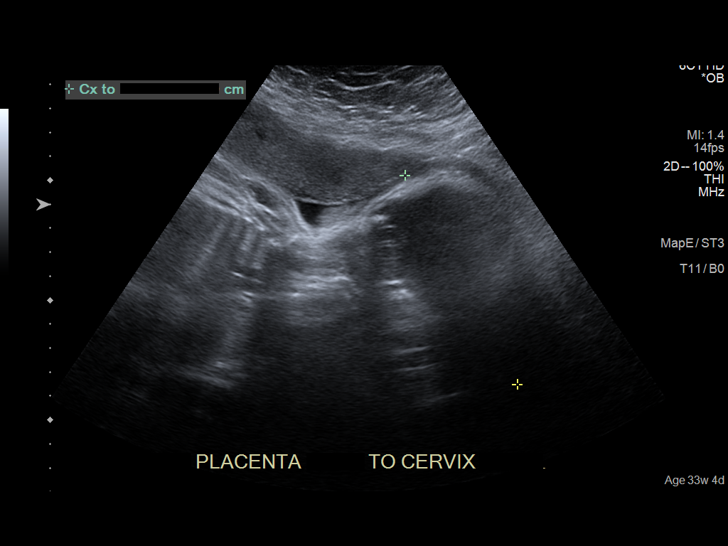
[im 13/27]
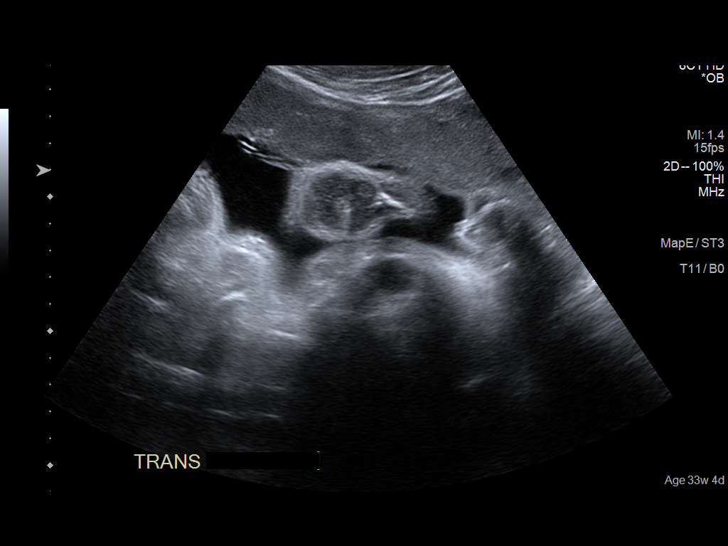
[im 15/27]
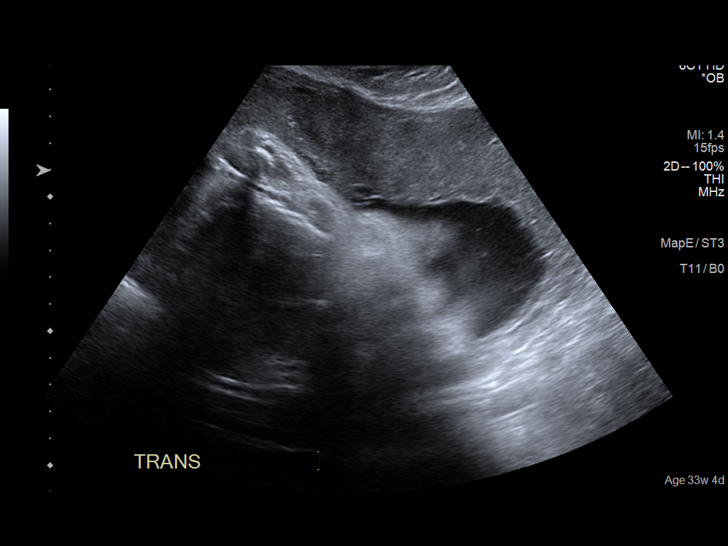
[im 17/27]
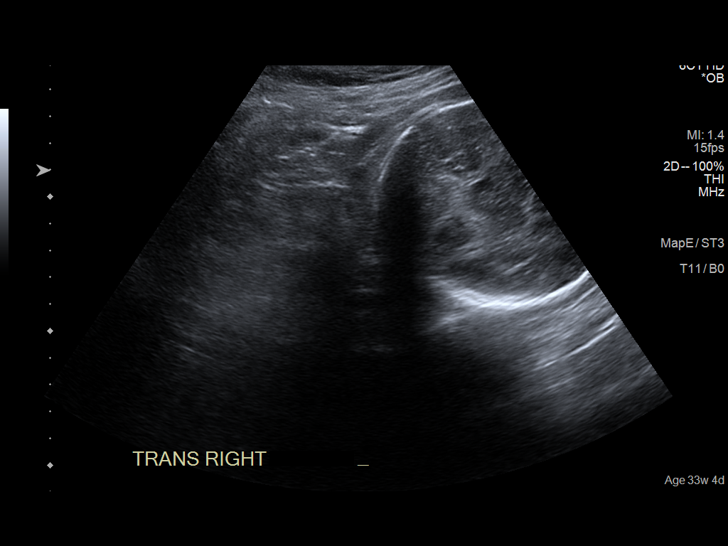
[im 19/27]
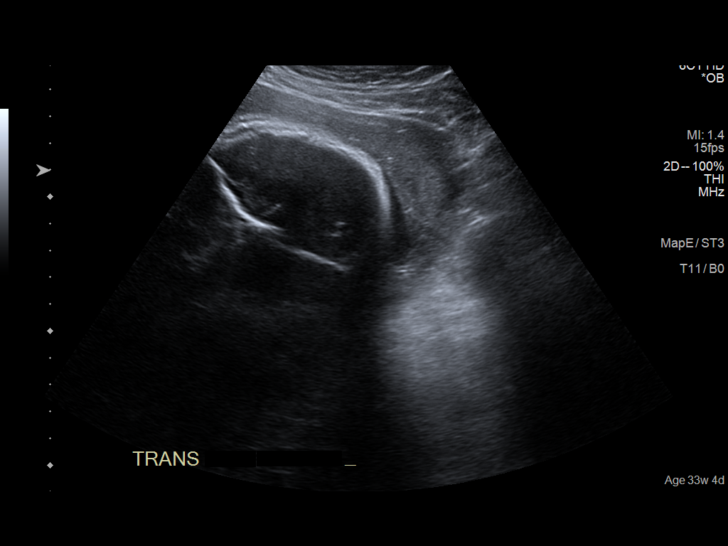
[im 21/27]
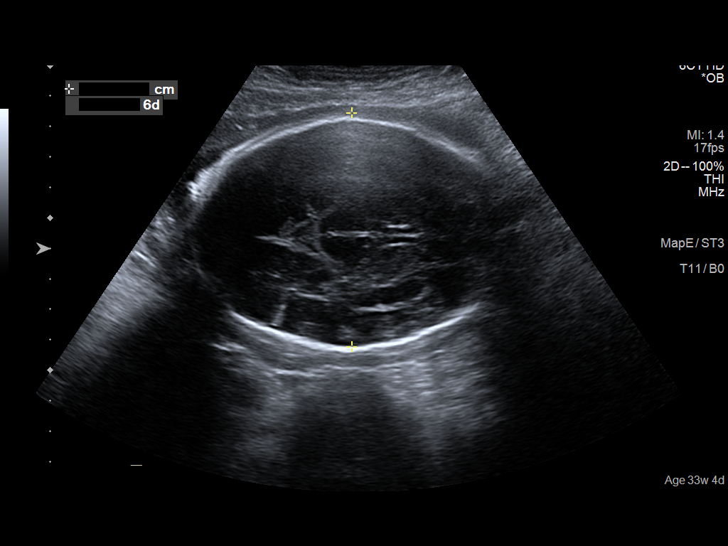
[im 23/27]
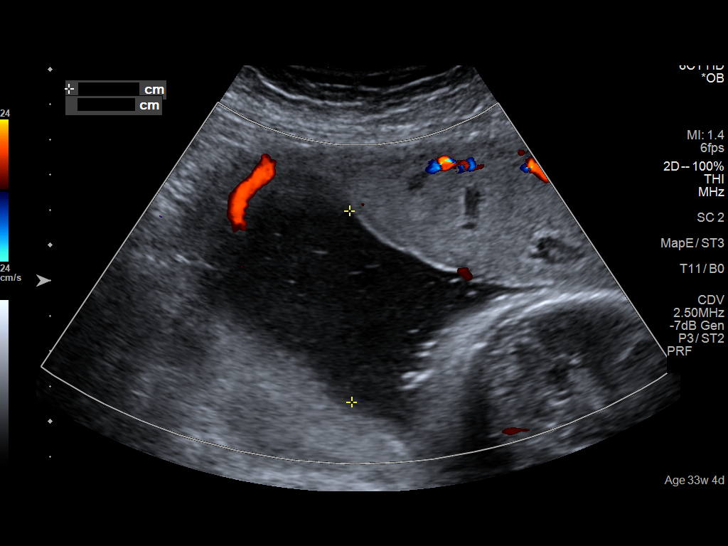
[im 25/27]
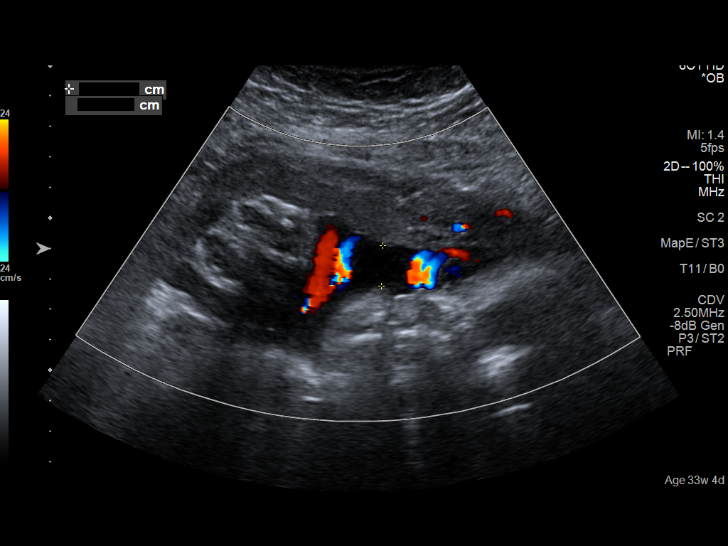
[im 27/27]
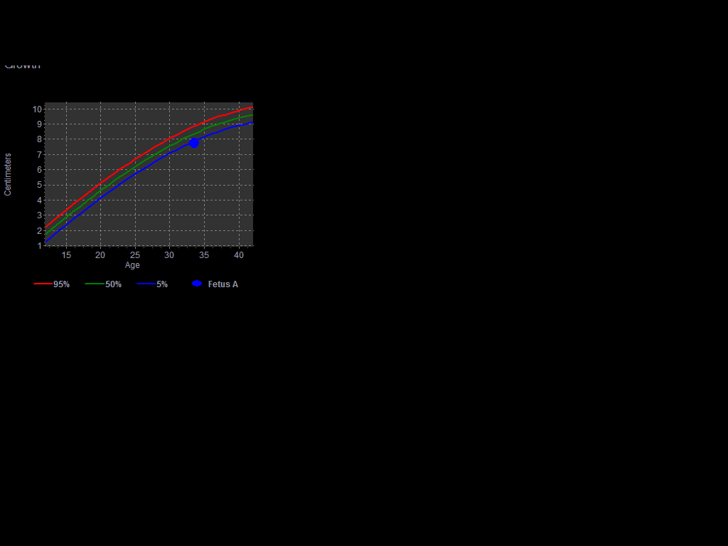

[14 of 27 positions shown; findings below may reference images not displayed]

FINDINGS: Number of Fetuses: 1

Heart Rate:  141 bpm

Movement: Yes

Presentation: Cephalic

Previa: No

Placental Location: Anterior

Amniotic Fluid (Subjective): Within normal limits

AFI 11.9 cm

BPD:  7.7cm 31w 0d

Maternal Findings:

Cervix:  3.6 cm TA

Uterus/Adnexae: No abnormality visualized.
IMPRESSION: Single living intrauterine fetus in cephalic presentation.

Amniotic fluid volume within normal limits, with AFI of 11.9 cm.
